# Patient Record
Sex: Male | Born: 1940 | Race: White | Hispanic: No | Marital: Married | Smoking: Former smoker
Health system: Southern US, Community
[De-identification: ages and names within clinical notes are randomized; demographics above are authoritative.]

## PROBLEM LIST (undated history)

## (undated) DIAGNOSIS — I10 Essential (primary) hypertension: Secondary | ICD-10-CM

## (undated) DIAGNOSIS — M1990 Unspecified osteoarthritis, unspecified site: Secondary | ICD-10-CM

## (undated) DIAGNOSIS — K219 Gastro-esophageal reflux disease without esophagitis: Secondary | ICD-10-CM

## (undated) DIAGNOSIS — Z923 Personal history of irradiation: Secondary | ICD-10-CM

## (undated) HISTORY — PX: EYE SURGERY: SHX253

## (undated) HISTORY — PX: ABSCESS DRAINAGE: SHX1119

## (undated) HISTORY — PX: TONSILLECTOMY: SUR1361

## (undated) HISTORY — PX: OTHER SURGICAL HISTORY: SHX169

---

## 2000-08-23 ENCOUNTER — Ambulatory Visit (HOSPITAL_COMMUNITY): Admission: RE | Admit: 2000-08-23 | Discharge: 2000-08-24 | Payer: Self-pay | Admitting: Ophthalmology

## 2000-08-23 ENCOUNTER — Encounter: Payer: Self-pay | Admitting: Ophthalmology

## 2003-09-10 ENCOUNTER — Encounter: Admission: RE | Admit: 2003-09-10 | Discharge: 2003-09-10 | Payer: Self-pay | Admitting: Internal Medicine

## 2008-10-14 ENCOUNTER — Encounter: Admission: RE | Admit: 2008-10-14 | Discharge: 2008-10-14 | Payer: Self-pay | Admitting: Internal Medicine

## 2008-10-19 ENCOUNTER — Emergency Department (HOSPITAL_COMMUNITY): Admission: EM | Admit: 2008-10-19 | Discharge: 2008-10-19 | Payer: Self-pay | Admitting: Emergency Medicine

## 2009-02-11 ENCOUNTER — Ambulatory Visit (HOSPITAL_COMMUNITY): Admission: RE | Admit: 2009-02-11 | Discharge: 2009-02-11 | Payer: Self-pay | Admitting: Cardiovascular Disease

## 2010-06-11 LAB — DIFFERENTIAL
Basophils Relative: 0 % (ref 0–1)
Eosinophils Absolute: 0.1 10*3/uL (ref 0.0–0.7)
Eosinophils Relative: 1 % (ref 0–5)
Lymphs Abs: 1.7 10*3/uL (ref 0.7–4.0)
Monocytes Relative: 13 % — ABNORMAL HIGH (ref 3–12)

## 2010-06-11 LAB — POCT I-STAT, CHEM 8
BUN: 8 mg/dL (ref 6–23)
Calcium, Ion: 1.18 mmol/L (ref 1.12–1.32)
Creatinine, Ser: 1.2 mg/dL (ref 0.4–1.5)
Glucose, Bld: 115 mg/dL — ABNORMAL HIGH (ref 70–99)
Hemoglobin: 16.7 g/dL (ref 13.0–17.0)
TCO2: 25 mmol/L (ref 0–100)

## 2010-06-11 LAB — CBC
HCT: 42.8 % (ref 39.0–52.0)
MCHC: 33.7 g/dL (ref 30.0–36.0)
MCV: 99.4 fL (ref 78.0–100.0)
RBC: 4.31 MIL/uL (ref 4.22–5.81)
WBC: 11.6 10*3/uL — ABNORMAL HIGH (ref 4.0–10.5)

## 2010-07-22 NOTE — Op Note (Signed)
[place]  Patient:    Alex Franklin, Alex Franklin                       MRN: [id number] Proc. Date: 08/23/00 Adm. Date:  [id number] Attending:  Bertrum Sol                           Operative Report  DATE OF BIRTH:  14-May-1940.  PREOPERATIVE DIAGNOSIS:  Rhegmatogenous retinal detachment of the right eye.  PROCEDURES: 1. Scleral buckle, right eye. 2. Retinal photocoagulation, right eye.  SURGEON:  Beulah Gandy. Ashley Royalty, M.D.  ASSISTANT:  Lu Duffel, C.O.A., S.A.  ANESTHESIA:  General.  DESCRIPTION OF PROCEDURE:  Usual prep and drape.  A 360 degree limbal peritomy, isolation of four rectus muscles on 2-0 silk.  Localization of break at 6 oclock.  Scleral dissection from 2 oclock around to 12 oclock to admit a #279 intrascleral implant.  Diathermy placed in the bed.  The buckle elements were placed, and two sutures per quadrant were placed in the scleral flaps.  A 240 band was placed around the eye with a 270 sleeve at 2 oclock.  Perforation site chosen at 7 oclock in the posterior aspect of the bed.  A large amount of clear, colorless subretinal fluid came forth in a controlled manner.  The sutures were pulled up snugly as the fluid egressed. A 508G radial segment was placed beneath the break at 6 oclock.  Once the buckle elements were placed and all the fluid was released, indirect ophthalmoscopy showed the retina to be lying nicely on the scleral buckle. Four hundred fifty-nine laser burns were placed on the scleral buckle with a power of 500 milliwatts, 1000 microns each, and 0.1 second each.  The suture ends were trimmed.  The band was adjusted to a proper length, and the band ends were trimmed.  The conjunctiva was reposited with 7-0 chromic suture. Polymyxin and gentamicin were irrigated into [place] space.  Atropine solution was applied.  Marcaine was injected around the globe for postop pain. Decadron 10 mg was injected into the lower  subconjunctival space.  Closing tension was 10 with a Barraquer tonometer.  COMPLICATIONS:  None.  DURATION:  Two hours.  The patient was awakened and taken to recovery in satisfactory condition. DD:  08/23/00 TD:  08/24/00 Job: [id number]

## 2010-07-24 IMAGING — CT CT ANGIO CHEST
2 of 6 series · 19 of 46 positions shown · IV contrast (APPLIED)
Comparison: 10/19/2008.

CLINICAL DATA: Chest pain.  Evaluate for pulmonary embolism.

CT ANGIOGRAPHY CHEST WITH CONTRAST
TECHNIQUE: Multidetector CT imaging of the chest was performed
using the standard protocol during bolus administration of
intravenous contrast. Multiplanar CT image reconstructions
including MIPs were obtained to evaluate the vascular anatomy.
Contrast: 80 ml Tmnipaque-L22.

[Series 5: pe thins @ 1mm · axial · 0.70mm/px · z∈[-321,-37]mm · 16 of 312 slices shown]
[im 14/312  lung]
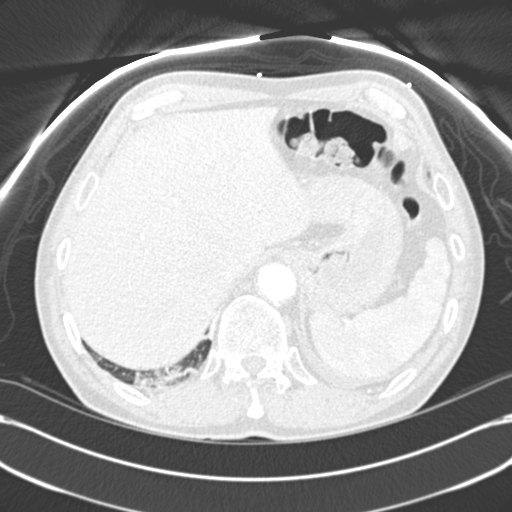
[im 41/312  soft-tissue]
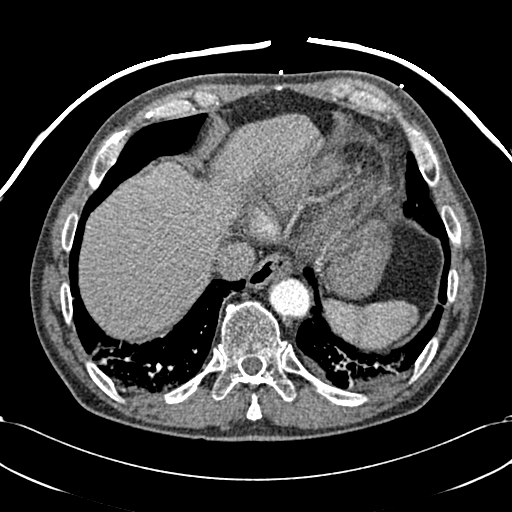
[im 55/312  lung]
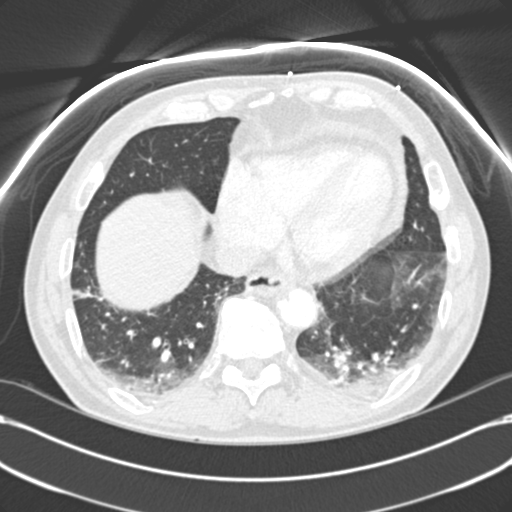
[im 68/312  soft-tissue]
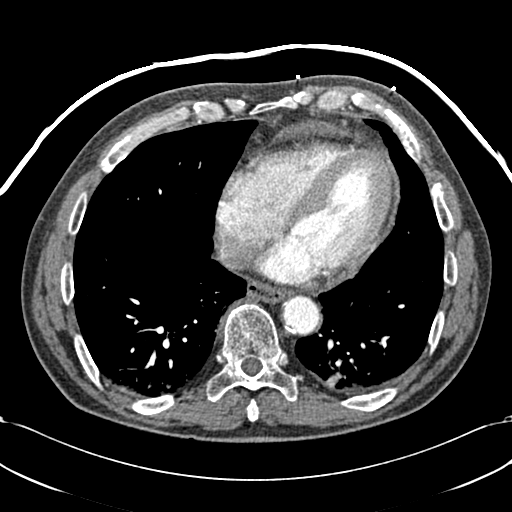
[im 95/312  lung]
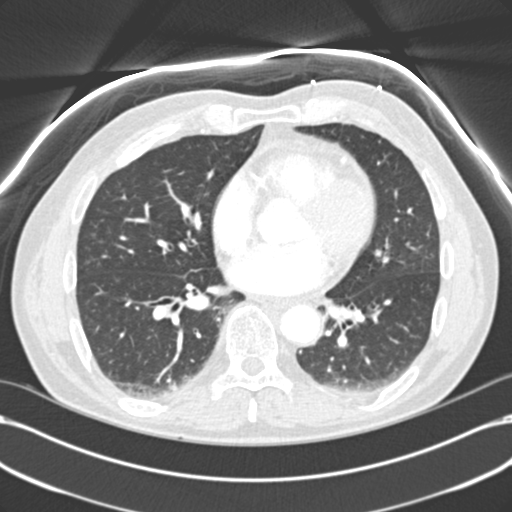
[im 109/312  soft-tissue]
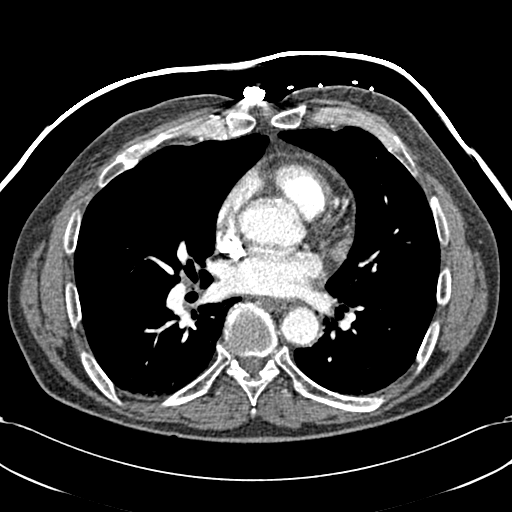
[im 122/312  lung]
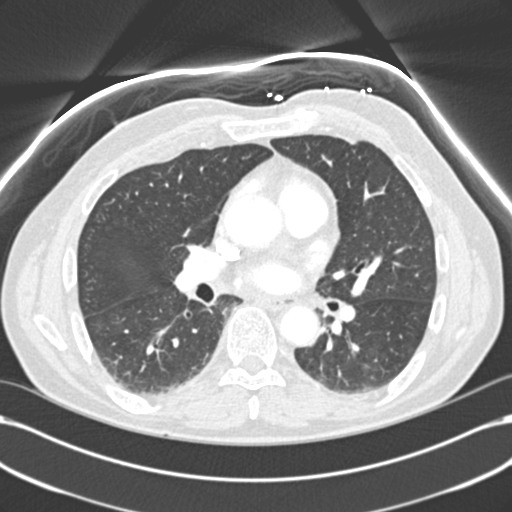
[im 149/312  soft-tissue]
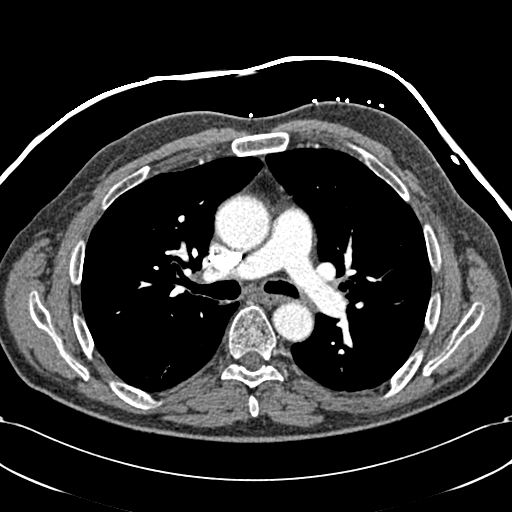
[im 163/312  lung]
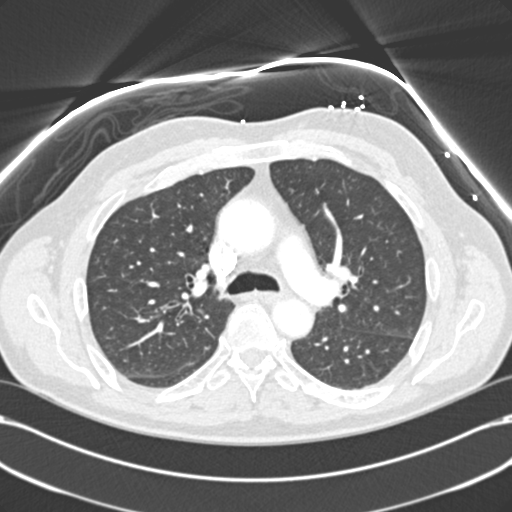
[im 190/312  soft-tissue]
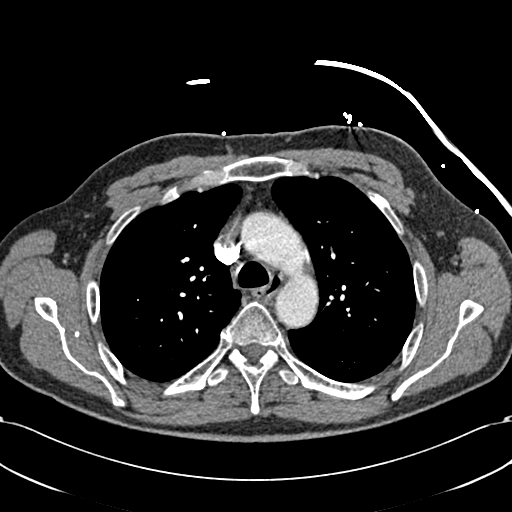
[im 203/312  lung]
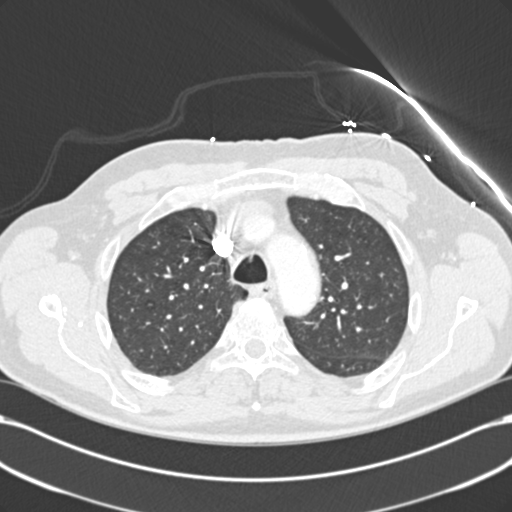
[im 217/312  soft-tissue]
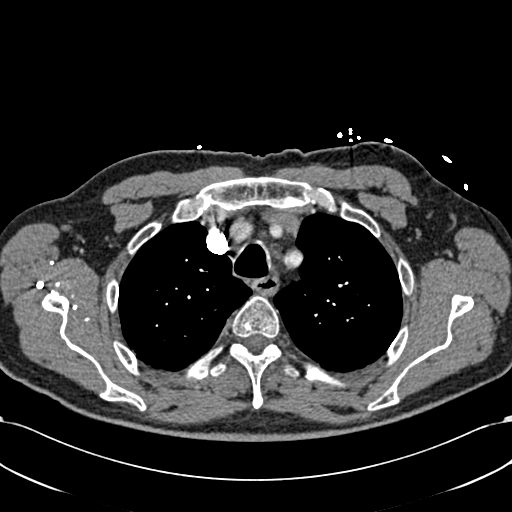
[im 244/312  lung]
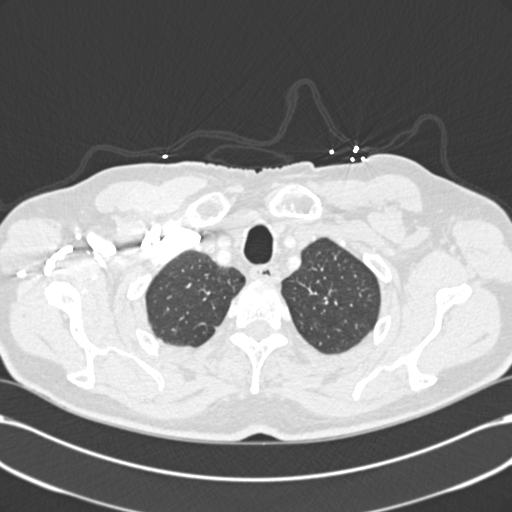
[im 257/312  soft-tissue]
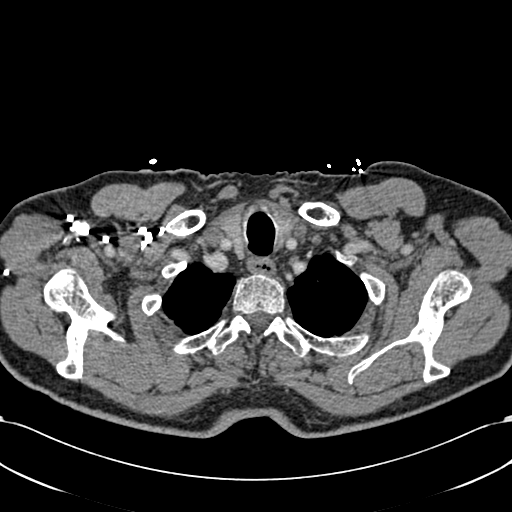
[im 271/312  lung]
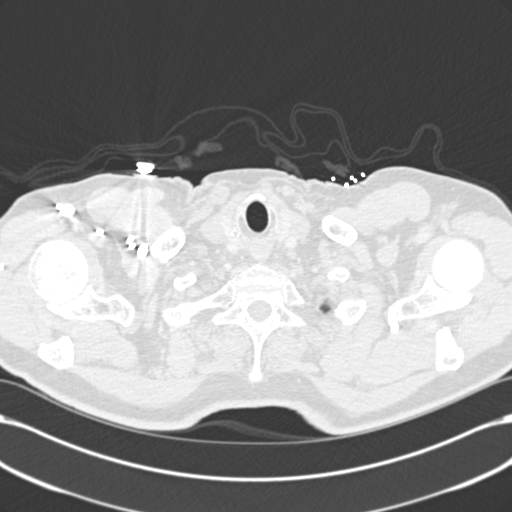
[im 298/312  soft-tissue]
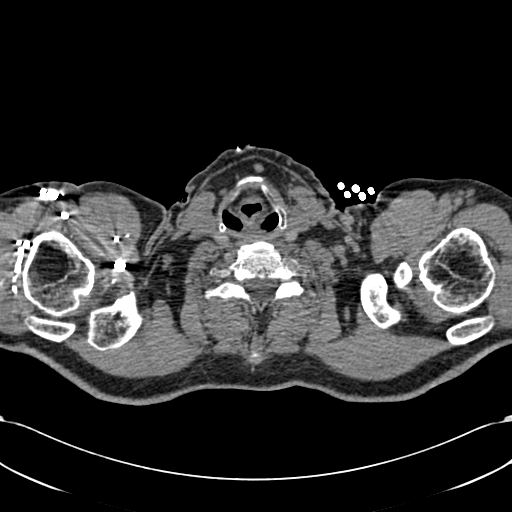

[Series 602: coronal mpr · coronal · 0.70mm/px · 3 of 128 slices shown]
[im 32/128  soft-tissue]
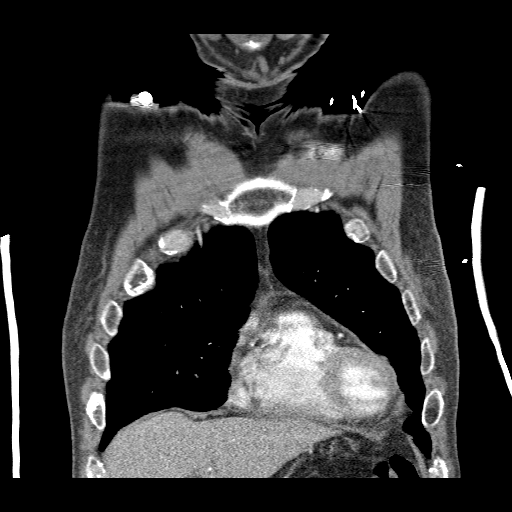
[im 64/128  soft-tissue]
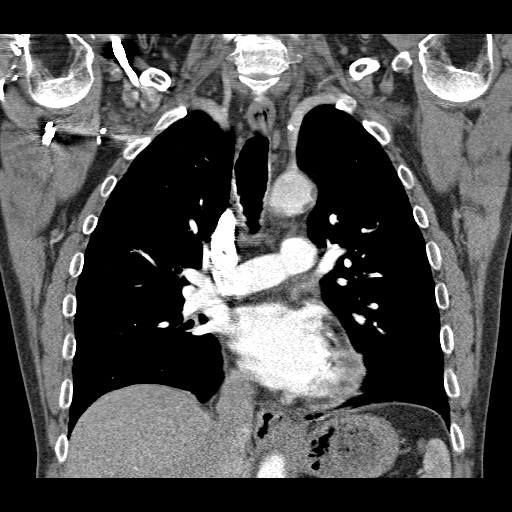
[im 96/128  soft-tissue]
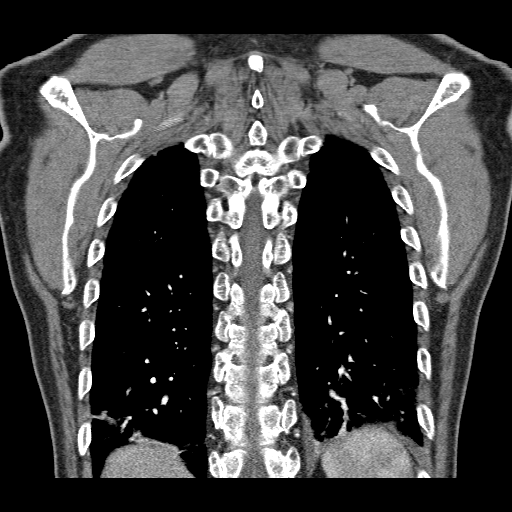

[19 of 46 positions shown; findings below may reference images not displayed]

FINDINGS: The study is technically adequate to evaluate for
pulmonary embolism.  No pulmonary embolus is present.  Acute aortic
abnormality.  Coronary artery atherosclerosis is present. If office
based assessment of coronary risk factors has not been performed,
it is now recommended.   Aortic atherosclerosis is present.  Bovine
arch.

No axillary adenopathy.  No pleural effusion is identified.  Small
amount of pericardial effusion or thickening is present anteriorly.
Incidental imaging of the upper abdomen is unremarkable.  Patulous
gastroesophageal junction. Lung windows demonstrate dependent
atelectasis, prominent in the lower lobes bilaterally.  No airspace
disease.  6 mm right upper lobe pulmonary nodule identified on
image number 55.  Follow-up required.  Healing and healed left
anterior and lateral rib fractures are present.  Some of these
demonstrate cortication and could represent pseudoarthrosis of the
ribs. Emphysematous changes are noted in the lungs.

Review of the MIP images confirms the above findings.
IMPRESSION: 1.  Technically adequate study without pulmonary embolus.
2.  Atherosclerosis and coronary artery disease.
3.  Anterior pericardial fluid or thickening.
4.  6 mm right upper lobe pulmonary nodule on image number 55.  3-
month follow-up chest CT recommended.

## 2011-08-23 ENCOUNTER — Other Ambulatory Visit (HOSPITAL_COMMUNITY): Payer: Self-pay | Admitting: Radiology

## 2011-08-23 DIAGNOSIS — J449 Chronic obstructive pulmonary disease, unspecified: Secondary | ICD-10-CM

## 2011-08-31 ENCOUNTER — Ambulatory Visit (HOSPITAL_COMMUNITY)
Admission: RE | Admit: 2011-08-31 | Discharge: 2011-08-31 | Disposition: A | Discharge status: Home / Self Care | Payer: Medicare Other | Source: Ambulatory Visit | Attending: Internal Medicine | Admitting: Internal Medicine

## 2011-08-31 DIAGNOSIS — J449 Chronic obstructive pulmonary disease, unspecified: Secondary | ICD-10-CM | POA: Insufficient documentation

## 2011-08-31 DIAGNOSIS — J4489 Other specified chronic obstructive pulmonary disease: Secondary | ICD-10-CM | POA: Insufficient documentation

## 2011-08-31 MED ORDER — ALBUTEROL SULFATE (5 MG/ML) 0.5% IN NEBU
2.5000 mg | INHALATION_SOLUTION | Freq: Once | RESPIRATORY_TRACT | Status: AC
  Administered 2011-08-31: 2.5 mg via RESPIRATORY_TRACT

## 2015-03-18 ENCOUNTER — Other Ambulatory Visit: Payer: Self-pay | Admitting: Internal Medicine

## 2015-03-18 ENCOUNTER — Ambulatory Visit
Admission: RE | Admit: 2015-03-18 | Discharge: 2015-03-18 | Disposition: A | Discharge status: Home / Self Care | Payer: PPO | Source: Ambulatory Visit | Attending: Internal Medicine | Admitting: Internal Medicine

## 2015-03-18 DIAGNOSIS — J849 Interstitial pulmonary disease, unspecified: Secondary | ICD-10-CM

## 2015-03-18 DIAGNOSIS — R059 Cough, unspecified: Secondary | ICD-10-CM

## 2015-03-18 DIAGNOSIS — R05 Cough: Secondary | ICD-10-CM | POA: Diagnosis not present

## 2015-03-18 DIAGNOSIS — C61 Malignant neoplasm of prostate: Secondary | ICD-10-CM | POA: Diagnosis not present

## 2015-03-18 DIAGNOSIS — I10 Essential (primary) hypertension: Secondary | ICD-10-CM | POA: Diagnosis not present

## 2015-03-18 DIAGNOSIS — E7800 Pure hypercholesterolemia, unspecified: Secondary | ICD-10-CM | POA: Diagnosis not present

## 2015-03-23 ENCOUNTER — Other Ambulatory Visit: Payer: Self-pay | Admitting: Internal Medicine

## 2015-04-06 ENCOUNTER — Other Ambulatory Visit: Payer: Self-pay | Admitting: Internal Medicine

## 2015-04-06 DIAGNOSIS — S22080A Wedge compression fracture of T11-T12 vertebra, initial encounter for closed fracture: Secondary | ICD-10-CM

## 2015-04-10 ENCOUNTER — Ambulatory Visit
Admission: RE | Admit: 2015-04-10 | Discharge: 2015-04-10 | Disposition: A | Discharge status: Home / Self Care | Payer: PPO | Source: Ambulatory Visit | Attending: Internal Medicine | Admitting: Internal Medicine

## 2015-04-10 DIAGNOSIS — M5134 Other intervertebral disc degeneration, thoracic region: Secondary | ICD-10-CM | POA: Diagnosis not present

## 2015-04-10 DIAGNOSIS — S22080A Wedge compression fracture of T11-T12 vertebra, initial encounter for closed fracture: Secondary | ICD-10-CM

## 2015-04-22 ENCOUNTER — Other Ambulatory Visit: Payer: Self-pay | Admitting: Gastroenterology

## 2015-05-31 ENCOUNTER — Encounter (HOSPITAL_COMMUNITY): Payer: Self-pay | Admitting: *Deleted

## 2015-06-07 ENCOUNTER — Encounter (HOSPITAL_COMMUNITY)
Admission: RE | Disposition: A | Discharge status: Home / Self Care | Payer: Self-pay | Source: Ambulatory Visit | Attending: Gastroenterology

## 2015-06-07 ENCOUNTER — Encounter (HOSPITAL_COMMUNITY): Payer: Self-pay

## 2015-06-07 ENCOUNTER — Ambulatory Visit (HOSPITAL_COMMUNITY): Payer: PPO | Admitting: Certified Registered"

## 2015-06-07 ENCOUNTER — Ambulatory Visit (HOSPITAL_COMMUNITY)
Admission: RE | Admit: 2015-06-07 | Discharge: 2015-06-07 | Disposition: A | Discharge status: Home / Self Care | Payer: PPO | Source: Ambulatory Visit | Attending: Gastroenterology | Admitting: Gastroenterology

## 2015-06-07 DIAGNOSIS — M1990 Unspecified osteoarthritis, unspecified site: Secondary | ICD-10-CM | POA: Diagnosis not present

## 2015-06-07 DIAGNOSIS — I10 Essential (primary) hypertension: Secondary | ICD-10-CM | POA: Diagnosis not present

## 2015-06-07 DIAGNOSIS — Z85828 Personal history of other malignant neoplasm of skin: Secondary | ICD-10-CM | POA: Diagnosis not present

## 2015-06-07 DIAGNOSIS — Z888 Allergy status to other drugs, medicaments and biological substances status: Secondary | ICD-10-CM | POA: Insufficient documentation

## 2015-06-07 DIAGNOSIS — Z87891 Personal history of nicotine dependence: Secondary | ICD-10-CM | POA: Insufficient documentation

## 2015-06-07 DIAGNOSIS — E7800 Pure hypercholesterolemia, unspecified: Secondary | ICD-10-CM | POA: Insufficient documentation

## 2015-06-07 DIAGNOSIS — Z1211 Encounter for screening for malignant neoplasm of colon: Secondary | ICD-10-CM | POA: Diagnosis not present

## 2015-06-07 DIAGNOSIS — K219 Gastro-esophageal reflux disease without esophagitis: Secondary | ICD-10-CM | POA: Diagnosis not present

## 2015-06-07 DIAGNOSIS — J449 Chronic obstructive pulmonary disease, unspecified: Secondary | ICD-10-CM | POA: Insufficient documentation

## 2015-06-07 DIAGNOSIS — Z8546 Personal history of malignant neoplasm of prostate: Secondary | ICD-10-CM | POA: Insufficient documentation

## 2015-06-07 HISTORY — PX: COLONOSCOPY WITH PROPOFOL: SHX5780

## 2015-06-07 HISTORY — DX: Essential (primary) hypertension: I10

## 2015-06-07 HISTORY — DX: Gastro-esophageal reflux disease without esophagitis: K21.9

## 2015-06-07 HISTORY — DX: Unspecified osteoarthritis, unspecified site: M19.90

## 2015-06-07 SURGERY — COLONOSCOPY WITH PROPOFOL
Anesthesia: Monitor Anesthesia Care

## 2015-06-07 MED ORDER — SODIUM CHLORIDE 0.9 % IV SOLN: INTRAVENOUS | Status: DC

## 2015-06-07 MED ORDER — LACTATED RINGERS IV SOLN
INTRAVENOUS | Status: DC
  Administered 2015-06-07: 11:00:00 via INTRAVENOUS

## 2015-06-07 MED ORDER — PROPOFOL 10 MG/ML IV BOLUS
INTRAVENOUS | Status: AC
  Filled 2015-06-07: qty 20

## 2015-06-07 MED ORDER — PROPOFOL 10 MG/ML IV BOLUS
INTRAVENOUS | Status: DC | PRN
  Administered 2015-06-07: 50 mg via INTRAVENOUS
  Administered 2015-06-07: 100 mg via INTRAVENOUS

## 2015-06-07 SURGICAL SUPPLY — 22 items

## 2015-06-07 NOTE — Transfer of Care (Signed)
Immediate Anesthesia Transfer of Care Note  Patient: Alex Franklin  Procedure(s) Performed: Procedure(s): COLONOSCOPY WITH PROPOFOL (N/A)  Patient Location: PACU  Anesthesia Type:MAC  Level of Consciousness: awake, alert , oriented and patient cooperative  Airway & Oxygen Therapy: Patient Spontanous Breathing  Post-op Assessment: Report given to RN, Post -op Vital signs reviewed and stable and Patient moving all extremities X 4  Post vital signs: Reviewed and stable  Last Vitals:  Filed Vitals:   06/07/15 1031  BP: 165/72  Pulse: 66  Temp: 36.5 C  Resp: 18    Complications: No apparent anesthesia complications

## 2015-06-07 NOTE — Anesthesia Preprocedure Evaluation (Signed)
Anesthesia Evaluation  Patient identified by MRN, date of birth, ID band  Reviewed: Allergy & Precautions, NPO status , Patient's Chart, lab work & pertinent test results  History of Anesthesia Complications Negative for: history of anesthetic complications  Airway Mallampati: II  TM Distance: >3 FB Neck ROM: Full    Dental  (+) Upper Dentures, Partial Lower   Pulmonary neg shortness of breath, neg sleep apnea, neg COPD, neg recent URI, former smoker,    breath sounds clear to auscultation       Cardiovascular hypertension, Pt. on medications  Rhythm:Regular     Neuro/Psych negative neurological ROS  negative psych ROS   GI/Hepatic Neg liver ROS, GERD  Medicated and Controlled,  Endo/Other  negative endocrine ROS  Renal/GU negative Renal ROS     Musculoskeletal  (+) Arthritis ,   Abdominal   Peds  Hematology negative hematology ROS (+)   Anesthesia Other Findings   Reproductive/Obstetrics                             Anesthesia Physical Anesthesia Plan  ASA: II  Anesthesia Plan: MAC   Post-op Pain Management:    Induction: Intravenous  Airway Management Planned: Nasal Cannula, Simple Face Mask and Natural Airway  Additional Equipment: None  Intra-op Plan:   Post-operative Plan:   Informed Consent: I have reviewed the patients History and Physical, chart, labs and discussed the procedure including the risks, benefits and alternatives for the proposed anesthesia with the patient or authorized representative who has indicated his/her understanding and acceptance.   Dental advisory given  Plan Discussed with: CRNA  Anesthesia Plan Comments:         Anesthesia Quick Evaluation

## 2015-06-07 NOTE — Discharge Instructions (Signed)
Colonoscopy °A colonoscopy is an exam to look at your colon. This exam can help find lumps (tumors), growths (polyps), bleeding, and redness and puffiness (inflammation) in your colon.  °BEFORE THE PROCEDURE °· Ask your doctor about changing or stopping your regular medicines. °· You may need to drink a large amount of a special liquid (oral bowel prep). You start drinking this the day before your procedure. It will cause you to have watery poop (stool). This cleans out your colon. °· Do not eat or drink anything else once you have started the bowel prep, unless your doctor tells you it is safe to do so. °· Make plans for someone to drive you home after the procedure. °PROCEDURE °· You will be given medicine to help you relax (sedative). °· You will lie on your side with your knees bent. °· A tube with a camera on the end is put in the opening of your butt (anus) and into your colon. Pictures are sent to a computer screen. Your doctor will look for anything that is not normal. °· Your doctor may take a tissue sample (biopsy) from your colon to be looked at more closely. °· The exam is finished when your doctor has viewed all of the colon. °AFTER THE PROCEDURE °· Do not drive for 24 hours after the exam. °· You may have a small amount of blood in your poop. This is normal. °· You may pass gas and have belly (abdominal) cramps. This is normal. °· Ask when your test results will be ready. Make sure you get your test results. °  °This information is not intended to replace advice given to you by your health care provider. Make sure you discuss any questions you have with your health care provider. °  °Document Released: 03/25/2010 Document Revised: 02/25/2013 Document Reviewed: 10/28/2012 °Elsevier Interactive Patient Education ©2016 [employer] ° °

## 2015-06-07 NOTE — Anesthesia Postprocedure Evaluation (Signed)
Anesthesia Post Note  Patient: Alex Franklin  Procedure(s) Performed: Procedure(s) (LRB): COLONOSCOPY WITH PROPOFOL (N/A)  Patient location during evaluation: Endoscopy Anesthesia Type: MAC Level of consciousness: awake Pain management: pain level controlled Vital Signs Assessment: post-procedure vital signs reviewed and stable Respiratory status: spontaneous breathing Cardiovascular status: stable Postop Assessment: no signs of nausea or vomiting Anesthetic complications: no    Last Vitals:  Filed Vitals:   06/07/15 1116 06/07/15 1119  BP: 127/67   Pulse: 61   Temp:  36 C  Resp: 15     Last Pain: There were no vitals filed for this visit.               Hailley Byers

## 2015-06-07 NOTE — Op Note (Signed)
[facility], [place] Patient Name: Alex Franklin Procedure Date: 06/07/2015 MRN: [id number] Attending MD: Garlan Fair , MD Date of Birth: Sep 28, 1940 CSN:  Age: 75 Admit Type: Outpatient Procedure:                Colonoscopy Indications:              Screening for colorectal malignant neoplasm. Normal                            screening colonoscopy was performed on 12/05/2005 Providers:                Garlan Fair, MD, Tory Emerald, RN, Cletis Athens, Technician Referring MD:              Medicines:                Propofol per Anesthesia Complications:            No immediate complications. Estimated Blood Loss:     Estimated blood loss: none. Procedure:                Pre-Anesthesia Assessment:                           - Prior to the procedure, a History and Physical                            was performed, and patient medications and                            allergies were reviewed. The patient's tolerance of                            previous anesthesia was also reviewed. The risks                            and benefits of the procedure and the sedation                            options and risks were discussed with the patient.                            All questions were answered, and informed consent                            was obtained. Prior Anticoagulants: The patient has                            taken aspirin, last dose was day of procedure. ASA                            Grade Assessment: III - A patient with severe  systemic disease. After reviewing the risks and                            benefits, the patient was deemed in satisfactory                            condition to undergo the procedure.                           After obtaining informed consent, the colonoscope                            was passed under direct vision. Throughout the                            procedure, the  patient's blood pressure, pulse, and                            oxygen saturations were monitored continuously. The                            [id number] scope was introduced through                            the anus and advanced to the the cecum, identified                            by appendiceal orifice and ileocecal valve. The                            colonoscopy was performed without difficulty. The                            patient tolerated the procedure well. The quality                            of the bowel preparation was good. The appendiceal                            orifice and the rectum were photographed. Scope In: 10:55:47 AM Scope Out: 11:11:15 AM Scope Withdrawal Time: 0 hours 13 minutes 7 seconds  Total Procedure Duration: 0 hours 15 minutes 28 seconds  Findings:      The perianal and digital rectal examinations were normal.      The entire examined colon appeared normal. Impression:               - The entire examined colon is normal.                           - No specimens collected. Moderate Sedation:      N/A- Per Anesthesia Care Recommendation:           - Patient has a contact number available for  emergencies. The signs and symptoms of potential                            delayed complications were discussed with the                            patient. Return to normal activities tomorrow.                            Written discharge instructions were provided to the                            patient.                           - Repeat colonoscopy is not recommended for                            screening purposes.                           - Resume previous diet.                           - Continue present medications. Procedure Code(s):        --- Professional ---                           [phone], Colonoscopy, flexible; diagnostic, including                            collection of specimen(s) by brushing or  washing,                            when performed (separate procedure) Diagnosis Code(s):        --- Professional ---                           Z12.11, Encounter for screening for malignant                            neoplasm of colon CPT copyright 2016 American Medical Association. All rights reserved. The codes documented in this report are preliminary and upon coder review may  be revised to meet current compliance requirements. Earle Gell, MD Garlan Fair, MD 06/07/2015 11:17:33 AM This report has been signed electronically. Number of Addenda: 0

## 2015-06-07 NOTE — H&P (Signed)
  Procedure: Screening colonoscopy. Normal screening colonoscopy performed on 12/05/2005  History: The patient is a 75 year old male born [birth year]. He is scheduled to undergo a screening colonoscopy today  Past medical history: Hypertension. Hypercholesterolemia. Prostatectomy performed for prostate cancer. Interstitial lung disease. Squamous cell skin cancer. Detached retina repair. Lumbar spinal stenosis. Chronic obstructive pulmonary disease. Right lung nodule by CT scan. Bilateral cataract surgery. Vasectomy.  Medication allergies: Hydrochlorothiazide caused hyponatremic  Exam: The patient is alert and lying comfortably on the endoscopy stretcher. Abdomen is soft and nontender to palpation. Lungs are clear to auscultation. Cardiac exam reveals a regular rhythm.  Plan: Proceed with screening colonoscopy

## 2015-06-08 DIAGNOSIS — L570 Actinic keratosis: Secondary | ICD-10-CM | POA: Diagnosis not present

## 2015-06-08 DIAGNOSIS — D225 Melanocytic nevi of trunk: Secondary | ICD-10-CM | POA: Diagnosis not present

## 2015-06-08 DIAGNOSIS — B353 Tinea pedis: Secondary | ICD-10-CM | POA: Diagnosis not present

## 2015-06-08 DIAGNOSIS — D692 Other nonthrombocytopenic purpura: Secondary | ICD-10-CM | POA: Diagnosis not present

## 2015-06-08 DIAGNOSIS — L821 Other seborrheic keratosis: Secondary | ICD-10-CM | POA: Diagnosis not present

## 2015-06-09 ENCOUNTER — Encounter (HOSPITAL_COMMUNITY): Payer: Self-pay | Admitting: Gastroenterology

## 2015-06-29 DIAGNOSIS — R05 Cough: Secondary | ICD-10-CM | POA: Diagnosis not present

## 2015-06-29 DIAGNOSIS — J849 Interstitial pulmonary disease, unspecified: Secondary | ICD-10-CM | POA: Diagnosis not present

## 2015-06-29 DIAGNOSIS — J019 Acute sinusitis, unspecified: Secondary | ICD-10-CM | POA: Diagnosis not present

## 2015-07-09 DIAGNOSIS — Z0000 Encounter for general adult medical examination without abnormal findings: Secondary | ICD-10-CM | POA: Diagnosis not present

## 2015-07-09 DIAGNOSIS — R3129 Other microscopic hematuria: Secondary | ICD-10-CM | POA: Diagnosis not present

## 2015-07-09 DIAGNOSIS — Z8546 Personal history of malignant neoplasm of prostate: Secondary | ICD-10-CM | POA: Diagnosis not present

## 2015-07-16 DIAGNOSIS — G8929 Other chronic pain: Secondary | ICD-10-CM | POA: Diagnosis not present

## 2015-07-16 DIAGNOSIS — M5136 Other intervertebral disc degeneration, lumbar region: Secondary | ICD-10-CM | POA: Diagnosis not present

## 2015-07-16 DIAGNOSIS — M545 Low back pain: Secondary | ICD-10-CM | POA: Diagnosis not present

## 2015-07-20 DIAGNOSIS — R3129 Other microscopic hematuria: Secondary | ICD-10-CM | POA: Diagnosis not present

## 2015-07-20 DIAGNOSIS — N281 Cyst of kidney, acquired: Secondary | ICD-10-CM | POA: Diagnosis not present

## 2015-08-11 DIAGNOSIS — M5136 Other intervertebral disc degeneration, lumbar region: Secondary | ICD-10-CM | POA: Diagnosis not present

## 2015-09-21 DIAGNOSIS — M4854XA Collapsed vertebra, not elsewhere classified, thoracic region, initial encounter for fracture: Secondary | ICD-10-CM | POA: Diagnosis not present

## 2015-09-21 DIAGNOSIS — J849 Interstitial pulmonary disease, unspecified: Secondary | ICD-10-CM | POA: Diagnosis not present

## 2015-09-21 DIAGNOSIS — R05 Cough: Secondary | ICD-10-CM | POA: Diagnosis not present

## 2015-09-21 DIAGNOSIS — Z0000 Encounter for general adult medical examination without abnormal findings: Secondary | ICD-10-CM | POA: Diagnosis not present

## 2015-09-21 DIAGNOSIS — Z1389 Encounter for screening for other disorder: Secondary | ICD-10-CM | POA: Diagnosis not present

## 2015-09-21 DIAGNOSIS — J440 Chronic obstructive pulmonary disease with acute lower respiratory infection: Secondary | ICD-10-CM | POA: Diagnosis not present

## 2015-09-21 DIAGNOSIS — E7800 Pure hypercholesterolemia, unspecified: Secondary | ICD-10-CM | POA: Diagnosis not present

## 2015-09-21 DIAGNOSIS — Z8546 Personal history of malignant neoplasm of prostate: Secondary | ICD-10-CM | POA: Diagnosis not present

## 2015-09-21 DIAGNOSIS — E871 Hypo-osmolality and hyponatremia: Secondary | ICD-10-CM | POA: Diagnosis not present

## 2015-09-21 DIAGNOSIS — I10 Essential (primary) hypertension: Secondary | ICD-10-CM | POA: Diagnosis not present

## 2015-10-08 DIAGNOSIS — Z8546 Personal history of malignant neoplasm of prostate: Secondary | ICD-10-CM | POA: Diagnosis not present

## 2015-10-08 DIAGNOSIS — D49511 Neoplasm of unspecified behavior of right kidney: Secondary | ICD-10-CM | POA: Diagnosis not present

## 2015-10-08 DIAGNOSIS — R3121 Asymptomatic microscopic hematuria: Secondary | ICD-10-CM | POA: Diagnosis not present

## 2015-10-19 DIAGNOSIS — E7800 Pure hypercholesterolemia, unspecified: Secondary | ICD-10-CM | POA: Diagnosis not present

## 2015-10-19 DIAGNOSIS — I10 Essential (primary) hypertension: Secondary | ICD-10-CM | POA: Diagnosis not present

## 2015-10-19 DIAGNOSIS — Z1389 Encounter for screening for other disorder: Secondary | ICD-10-CM | POA: Diagnosis not present

## 2015-10-19 DIAGNOSIS — C61 Malignant neoplasm of prostate: Secondary | ICD-10-CM | POA: Diagnosis not present

## 2015-10-19 DIAGNOSIS — J440 Chronic obstructive pulmonary disease with acute lower respiratory infection: Secondary | ICD-10-CM | POA: Diagnosis not present

## 2015-10-19 DIAGNOSIS — J849 Interstitial pulmonary disease, unspecified: Secondary | ICD-10-CM | POA: Diagnosis not present

## 2016-04-20 DIAGNOSIS — C61 Malignant neoplasm of prostate: Secondary | ICD-10-CM | POA: Diagnosis not present

## 2016-04-20 DIAGNOSIS — J849 Interstitial pulmonary disease, unspecified: Secondary | ICD-10-CM | POA: Diagnosis not present

## 2016-04-20 DIAGNOSIS — J440 Chronic obstructive pulmonary disease with acute lower respiratory infection: Secondary | ICD-10-CM | POA: Diagnosis not present

## 2016-04-20 DIAGNOSIS — E871 Hypo-osmolality and hyponatremia: Secondary | ICD-10-CM | POA: Diagnosis not present

## 2016-04-20 DIAGNOSIS — E7800 Pure hypercholesterolemia, unspecified: Secondary | ICD-10-CM | POA: Diagnosis not present

## 2016-04-20 DIAGNOSIS — I10 Essential (primary) hypertension: Secondary | ICD-10-CM | POA: Diagnosis not present

## 2016-04-20 DIAGNOSIS — Z23 Encounter for immunization: Secondary | ICD-10-CM | POA: Diagnosis not present

## 2016-08-22 DIAGNOSIS — L821 Other seborrheic keratosis: Secondary | ICD-10-CM | POA: Diagnosis not present

## 2016-08-22 DIAGNOSIS — B353 Tinea pedis: Secondary | ICD-10-CM | POA: Diagnosis not present

## 2016-08-22 DIAGNOSIS — D224 Melanocytic nevi of scalp and neck: Secondary | ICD-10-CM | POA: Diagnosis not present

## 2016-08-22 DIAGNOSIS — D692 Other nonthrombocytopenic purpura: Secondary | ICD-10-CM | POA: Diagnosis not present

## 2016-08-22 DIAGNOSIS — L570 Actinic keratosis: Secondary | ICD-10-CM | POA: Diagnosis not present

## 2017-01-03 ENCOUNTER — Other Ambulatory Visit (HOSPITAL_COMMUNITY): Payer: Self-pay | Admitting: Urology

## 2017-01-03 DIAGNOSIS — R3121 Asymptomatic microscopic hematuria: Secondary | ICD-10-CM | POA: Diagnosis not present

## 2017-01-03 DIAGNOSIS — Z8546 Personal history of malignant neoplasm of prostate: Secondary | ICD-10-CM | POA: Diagnosis not present

## 2017-01-03 DIAGNOSIS — D49511 Neoplasm of unspecified behavior of right kidney: Secondary | ICD-10-CM

## 2017-01-17 ENCOUNTER — Ambulatory Visit (HOSPITAL_COMMUNITY)
Admission: RE | Admit: 2017-01-17 | Discharge: 2017-01-17 | Disposition: A | Discharge status: Home / Self Care | Payer: PPO | Source: Ambulatory Visit | Attending: Urology | Admitting: Urology

## 2017-01-31 ENCOUNTER — Ambulatory Visit (HOSPITAL_COMMUNITY)
Admission: RE | Admit: 2017-01-31 | Discharge: 2017-01-31 | Disposition: A | Discharge status: Home / Self Care | Payer: PPO | Source: Ambulatory Visit | Attending: Urology | Admitting: Urology

## 2017-01-31 DIAGNOSIS — R911 Solitary pulmonary nodule: Secondary | ICD-10-CM | POA: Diagnosis not present

## 2017-01-31 DIAGNOSIS — N281 Cyst of kidney, acquired: Secondary | ICD-10-CM | POA: Insufficient documentation

## 2017-01-31 DIAGNOSIS — D49511 Neoplasm of unspecified behavior of right kidney: Secondary | ICD-10-CM | POA: Insufficient documentation

## 2017-01-31 DIAGNOSIS — I700 Atherosclerosis of aorta: Secondary | ICD-10-CM | POA: Insufficient documentation

## 2017-01-31 MED ORDER — GADOBENATE DIMEGLUMINE 529 MG/ML IV SOLN
20.0000 mL | Freq: Once | INTRAVENOUS | Status: AC | PRN
  Administered 2017-01-31: 16 mL via INTRAVENOUS

## 2017-08-01 DIAGNOSIS — E7800 Pure hypercholesterolemia, unspecified: Secondary | ICD-10-CM | POA: Diagnosis not present

## 2017-08-01 DIAGNOSIS — I10 Essential (primary) hypertension: Secondary | ICD-10-CM | POA: Diagnosis not present

## 2017-08-01 DIAGNOSIS — J440 Chronic obstructive pulmonary disease with acute lower respiratory infection: Secondary | ICD-10-CM | POA: Diagnosis not present

## 2017-08-01 DIAGNOSIS — F17210 Nicotine dependence, cigarettes, uncomplicated: Secondary | ICD-10-CM | POA: Diagnosis not present

## 2017-08-28 DIAGNOSIS — J440 Chronic obstructive pulmonary disease with acute lower respiratory infection: Secondary | ICD-10-CM | POA: Diagnosis not present

## 2017-08-28 DIAGNOSIS — M48061 Spinal stenosis, lumbar region without neurogenic claudication: Secondary | ICD-10-CM | POA: Diagnosis not present

## 2017-08-28 DIAGNOSIS — C61 Malignant neoplasm of prostate: Secondary | ICD-10-CM | POA: Diagnosis not present

## 2017-08-28 DIAGNOSIS — Z0000 Encounter for general adult medical examination without abnormal findings: Secondary | ICD-10-CM | POA: Diagnosis not present

## 2017-08-28 DIAGNOSIS — E7800 Pure hypercholesterolemia, unspecified: Secondary | ICD-10-CM | POA: Diagnosis not present

## 2017-08-28 DIAGNOSIS — Z1389 Encounter for screening for other disorder: Secondary | ICD-10-CM | POA: Diagnosis not present

## 2017-08-28 DIAGNOSIS — I10 Essential (primary) hypertension: Secondary | ICD-10-CM | POA: Diagnosis not present

## 2017-08-28 DIAGNOSIS — E871 Hypo-osmolality and hyponatremia: Secondary | ICD-10-CM | POA: Diagnosis not present

## 2017-09-03 DIAGNOSIS — C61 Malignant neoplasm of prostate: Secondary | ICD-10-CM | POA: Diagnosis not present

## 2017-09-03 DIAGNOSIS — R3121 Asymptomatic microscopic hematuria: Secondary | ICD-10-CM | POA: Diagnosis not present

## 2017-09-03 DIAGNOSIS — N281 Cyst of kidney, acquired: Secondary | ICD-10-CM | POA: Diagnosis not present

## 2017-09-03 DIAGNOSIS — R9721 Rising PSA following treatment for malignant neoplasm of prostate: Secondary | ICD-10-CM | POA: Diagnosis not present

## 2017-12-11 DIAGNOSIS — D225 Melanocytic nevi of trunk: Secondary | ICD-10-CM | POA: Diagnosis not present

## 2017-12-11 DIAGNOSIS — L820 Inflamed seborrheic keratosis: Secondary | ICD-10-CM | POA: Diagnosis not present

## 2017-12-11 DIAGNOSIS — D224 Melanocytic nevi of scalp and neck: Secondary | ICD-10-CM | POA: Diagnosis not present

## 2017-12-11 DIAGNOSIS — D1801 Hemangioma of skin and subcutaneous tissue: Secondary | ICD-10-CM | POA: Diagnosis not present

## 2017-12-11 DIAGNOSIS — L821 Other seborrheic keratosis: Secondary | ICD-10-CM | POA: Diagnosis not present

## 2017-12-11 DIAGNOSIS — L603 Nail dystrophy: Secondary | ICD-10-CM | POA: Diagnosis not present

## 2018-03-04 DIAGNOSIS — Z8546 Personal history of malignant neoplasm of prostate: Secondary | ICD-10-CM | POA: Diagnosis not present

## 2018-03-04 DIAGNOSIS — I10 Essential (primary) hypertension: Secondary | ICD-10-CM | POA: Diagnosis not present

## 2018-03-04 DIAGNOSIS — J849 Interstitial pulmonary disease, unspecified: Secondary | ICD-10-CM | POA: Diagnosis not present

## 2018-03-04 DIAGNOSIS — E7800 Pure hypercholesterolemia, unspecified: Secondary | ICD-10-CM | POA: Diagnosis not present

## 2018-03-04 DIAGNOSIS — I700 Atherosclerosis of aorta: Secondary | ICD-10-CM | POA: Diagnosis not present

## 2018-03-04 DIAGNOSIS — Z9079 Acquired absence of other genital organ(s): Secondary | ICD-10-CM | POA: Diagnosis not present

## 2018-03-04 DIAGNOSIS — J440 Chronic obstructive pulmonary disease with acute lower respiratory infection: Secondary | ICD-10-CM | POA: Diagnosis not present

## 2018-08-21 DIAGNOSIS — H5202 Hypermetropia, left eye: Secondary | ICD-10-CM | POA: Diagnosis not present

## 2018-08-21 DIAGNOSIS — H52222 Regular astigmatism, left eye: Secondary | ICD-10-CM | POA: Diagnosis not present

## 2018-08-21 DIAGNOSIS — Z961 Presence of intraocular lens: Secondary | ICD-10-CM | POA: Diagnosis not present

## 2018-08-21 DIAGNOSIS — H33031 Retinal detachment with giant retinal tear, right eye: Secondary | ICD-10-CM | POA: Diagnosis not present

## 2018-09-18 DIAGNOSIS — E871 Hypo-osmolality and hyponatremia: Secondary | ICD-10-CM | POA: Diagnosis not present

## 2018-09-18 DIAGNOSIS — E7800 Pure hypercholesterolemia, unspecified: Secondary | ICD-10-CM | POA: Diagnosis not present

## 2018-09-18 DIAGNOSIS — J440 Chronic obstructive pulmonary disease with acute lower respiratory infection: Secondary | ICD-10-CM | POA: Diagnosis not present

## 2018-09-18 DIAGNOSIS — C61 Malignant neoplasm of prostate: Secondary | ICD-10-CM | POA: Diagnosis not present

## 2018-09-18 DIAGNOSIS — I700 Atherosclerosis of aorta: Secondary | ICD-10-CM | POA: Diagnosis not present

## 2018-09-18 DIAGNOSIS — Z1389 Encounter for screening for other disorder: Secondary | ICD-10-CM | POA: Diagnosis not present

## 2018-09-18 DIAGNOSIS — I10 Essential (primary) hypertension: Secondary | ICD-10-CM | POA: Diagnosis not present

## 2018-09-18 DIAGNOSIS — J849 Interstitial pulmonary disease, unspecified: Secondary | ICD-10-CM | POA: Diagnosis not present

## 2018-09-18 DIAGNOSIS — Z0000 Encounter for general adult medical examination without abnormal findings: Secondary | ICD-10-CM | POA: Diagnosis not present

## 2018-10-01 ENCOUNTER — Other Ambulatory Visit: Payer: Self-pay

## 2018-10-01 ENCOUNTER — Other Ambulatory Visit: Payer: Self-pay | Admitting: Internal Medicine

## 2018-10-01 ENCOUNTER — Ambulatory Visit
Admission: RE | Admit: 2018-10-01 | Discharge: 2018-10-01 | Disposition: A | Discharge status: Home / Self Care | Payer: PPO | Source: Ambulatory Visit | Attending: Internal Medicine | Admitting: Internal Medicine

## 2018-10-01 DIAGNOSIS — I10 Essential (primary) hypertension: Secondary | ICD-10-CM | POA: Diagnosis not present

## 2018-10-01 DIAGNOSIS — M4696 Unspecified inflammatory spondylopathy, lumbar region: Secondary | ICD-10-CM | POA: Diagnosis not present

## 2018-10-01 DIAGNOSIS — Z8546 Personal history of malignant neoplasm of prostate: Secondary | ICD-10-CM | POA: Diagnosis not present

## 2018-10-01 DIAGNOSIS — E7800 Pure hypercholesterolemia, unspecified: Secondary | ICD-10-CM | POA: Diagnosis not present

## 2018-10-01 DIAGNOSIS — C61 Malignant neoplasm of prostate: Secondary | ICD-10-CM

## 2018-10-01 DIAGNOSIS — J449 Chronic obstructive pulmonary disease, unspecified: Secondary | ICD-10-CM | POA: Diagnosis not present

## 2018-10-01 DIAGNOSIS — Z87891 Personal history of nicotine dependence: Secondary | ICD-10-CM | POA: Diagnosis not present

## 2018-12-06 DIAGNOSIS — M47816 Spondylosis without myelopathy or radiculopathy, lumbar region: Secondary | ICD-10-CM | POA: Diagnosis not present

## 2018-12-17 DIAGNOSIS — S0101XA Laceration without foreign body of scalp, initial encounter: Secondary | ICD-10-CM | POA: Diagnosis not present

## 2018-12-18 DIAGNOSIS — M47816 Spondylosis without myelopathy or radiculopathy, lumbar region: Secondary | ICD-10-CM | POA: Diagnosis not present

## 2018-12-23 DIAGNOSIS — Z4802 Encounter for removal of sutures: Secondary | ICD-10-CM | POA: Diagnosis not present

## 2018-12-23 DIAGNOSIS — S0101XD Laceration without foreign body of scalp, subsequent encounter: Secondary | ICD-10-CM | POA: Diagnosis not present

## 2019-01-13 DIAGNOSIS — M47816 Spondylosis without myelopathy or radiculopathy, lumbar region: Secondary | ICD-10-CM | POA: Diagnosis not present

## 2019-01-20 ENCOUNTER — Other Ambulatory Visit: Payer: Self-pay

## 2019-01-20 DIAGNOSIS — Z20822 Contact with and (suspected) exposure to covid-19: Secondary | ICD-10-CM

## 2019-01-22 LAB — NOVEL CORONAVIRUS, NAA: SARS-CoV-2, NAA: NOT DETECTED

## 2019-02-24 DIAGNOSIS — L821 Other seborrheic keratosis: Secondary | ICD-10-CM | POA: Diagnosis not present

## 2019-02-24 DIAGNOSIS — D225 Melanocytic nevi of trunk: Secondary | ICD-10-CM | POA: Diagnosis not present

## 2019-03-22 ENCOUNTER — Ambulatory Visit: Payer: Medicare Other | Attending: Internal Medicine

## 2019-03-22 DIAGNOSIS — Z23 Encounter for immunization: Secondary | ICD-10-CM | POA: Insufficient documentation

## 2019-03-22 NOTE — Progress Notes (Signed)
   Covid-19 Vaccination Clinic  Name:  Alex Franklin    MRN: [id number] DOB: Feb 07, 1941  03/22/2019  Alex Franklin was observed post Covid-19 immunization for 15 minutes without incidence. He was provided with Vaccine Information Sheet and instruction to access the V-Safe system.   Alex Franklin was instructed to call 911 with any severe reactions post vaccine: Marland Kitchen Difficulty breathing  . Swelling of your face and throat  . A fast heartbeat  . A bad rash all over your body  . Dizziness and weakness    Immunizations Administered    Name Date Dose VIS Date Route   Pfizer COVID-19 Vaccine 03/22/2019  1:44 PM 0.3 mL 02/14/2019 Intramuscular   Manufacturer: [employer], [employer]   Lot: [id number]   [place]: [id number]

## 2019-03-25 DIAGNOSIS — I700 Atherosclerosis of aorta: Secondary | ICD-10-CM | POA: Diagnosis not present

## 2019-03-25 DIAGNOSIS — J849 Interstitial pulmonary disease, unspecified: Secondary | ICD-10-CM | POA: Diagnosis not present

## 2019-03-25 DIAGNOSIS — R252 Cramp and spasm: Secondary | ICD-10-CM | POA: Diagnosis not present

## 2019-03-25 DIAGNOSIS — C61 Malignant neoplasm of prostate: Secondary | ICD-10-CM | POA: Diagnosis not present

## 2019-03-25 DIAGNOSIS — M48061 Spinal stenosis, lumbar region without neurogenic claudication: Secondary | ICD-10-CM | POA: Diagnosis not present

## 2019-03-25 DIAGNOSIS — E7800 Pure hypercholesterolemia, unspecified: Secondary | ICD-10-CM | POA: Diagnosis not present

## 2019-03-25 DIAGNOSIS — J440 Chronic obstructive pulmonary disease with acute lower respiratory infection: Secondary | ICD-10-CM | POA: Diagnosis not present

## 2019-03-25 DIAGNOSIS — I10 Essential (primary) hypertension: Secondary | ICD-10-CM | POA: Diagnosis not present

## 2019-04-11 ENCOUNTER — Ambulatory Visit: Payer: PPO | Attending: Internal Medicine

## 2019-04-11 DIAGNOSIS — Z23 Encounter for immunization: Secondary | ICD-10-CM | POA: Insufficient documentation

## 2019-04-11 NOTE — Progress Notes (Signed)
   Covid-19 Vaccination Clinic  Name:  Alex Franklin    MRN: [id number] DOB: October 04, 1940  04/11/2019  [name] was observed post Covid-19 immunization for 15 minutes without incidence. He was provided with Vaccine Information Sheet and instruction to access the V-Safe system.   [name] was instructed to call 911 with any severe reactions post vaccine: Marland Kitchen Difficulty breathing  . Swelling of your face and throat  . A fast heartbeat  . A bad rash all over your body  . Dizziness and weakness    Immunizations Administered    Name Date Dose VIS Date Route   Pfizer COVID-19 Vaccine 04/11/2019  8:47 AM 0.3 mL 02/14/2019 Intramuscular   Manufacturer: [place]   Lot: [id number]   [place]: [id number]

## 2019-10-01 DIAGNOSIS — I10 Essential (primary) hypertension: Secondary | ICD-10-CM | POA: Diagnosis not present

## 2019-10-01 DIAGNOSIS — I700 Atherosclerosis of aorta: Secondary | ICD-10-CM | POA: Diagnosis not present

## 2019-10-01 DIAGNOSIS — J440 Chronic obstructive pulmonary disease with acute lower respiratory infection: Secondary | ICD-10-CM | POA: Diagnosis not present

## 2019-10-01 DIAGNOSIS — Z0000 Encounter for general adult medical examination without abnormal findings: Secondary | ICD-10-CM | POA: Diagnosis not present

## 2019-10-01 DIAGNOSIS — M48061 Spinal stenosis, lumbar region without neurogenic claudication: Secondary | ICD-10-CM | POA: Diagnosis not present

## 2019-10-01 DIAGNOSIS — C61 Malignant neoplasm of prostate: Secondary | ICD-10-CM | POA: Diagnosis not present

## 2019-10-01 DIAGNOSIS — E7800 Pure hypercholesterolemia, unspecified: Secondary | ICD-10-CM | POA: Diagnosis not present

## 2019-10-01 DIAGNOSIS — Z1389 Encounter for screening for other disorder: Secondary | ICD-10-CM | POA: Diagnosis not present

## 2020-01-07 DIAGNOSIS — Z961 Presence of intraocular lens: Secondary | ICD-10-CM | POA: Diagnosis not present

## 2020-01-07 DIAGNOSIS — H5202 Hypermetropia, left eye: Secondary | ICD-10-CM | POA: Diagnosis not present

## 2020-01-07 DIAGNOSIS — H5211 Myopia, right eye: Secondary | ICD-10-CM | POA: Diagnosis not present

## 2020-01-07 DIAGNOSIS — H33031 Retinal detachment with giant retinal tear, right eye: Secondary | ICD-10-CM | POA: Diagnosis not present

## 2020-01-07 DIAGNOSIS — H524 Presbyopia: Secondary | ICD-10-CM | POA: Diagnosis not present

## 2020-01-07 DIAGNOSIS — H52222 Regular astigmatism, left eye: Secondary | ICD-10-CM | POA: Diagnosis not present

## 2020-01-07 DIAGNOSIS — H18053 Posterior corneal pigmentations, bilateral: Secondary | ICD-10-CM | POA: Diagnosis not present

## 2020-03-16 DIAGNOSIS — L218 Other seborrheic dermatitis: Secondary | ICD-10-CM | POA: Diagnosis not present

## 2020-03-16 DIAGNOSIS — L821 Other seborrheic keratosis: Secondary | ICD-10-CM | POA: Diagnosis not present

## 2020-03-16 DIAGNOSIS — L570 Actinic keratosis: Secondary | ICD-10-CM | POA: Diagnosis not present

## 2020-03-16 DIAGNOSIS — D225 Melanocytic nevi of trunk: Secondary | ICD-10-CM | POA: Diagnosis not present

## 2020-03-16 DIAGNOSIS — D1801 Hemangioma of skin and subcutaneous tissue: Secondary | ICD-10-CM | POA: Diagnosis not present

## 2020-04-01 ENCOUNTER — Ambulatory Visit
Admission: RE | Admit: 2020-04-01 | Discharge: 2020-04-01 | Disposition: A | Discharge status: Home / Self Care | Payer: PPO | Source: Ambulatory Visit | Attending: Internal Medicine | Admitting: Internal Medicine

## 2020-04-01 ENCOUNTER — Other Ambulatory Visit: Payer: Self-pay | Admitting: Internal Medicine

## 2020-04-01 DIAGNOSIS — E7800 Pure hypercholesterolemia, unspecified: Secondary | ICD-10-CM | POA: Diagnosis not present

## 2020-04-01 DIAGNOSIS — I10 Essential (primary) hypertension: Secondary | ICD-10-CM | POA: Diagnosis not present

## 2020-04-01 DIAGNOSIS — J440 Chronic obstructive pulmonary disease with acute lower respiratory infection: Secondary | ICD-10-CM

## 2020-04-01 DIAGNOSIS — J449 Chronic obstructive pulmonary disease, unspecified: Secondary | ICD-10-CM | POA: Diagnosis not present

## 2020-04-01 DIAGNOSIS — J209 Acute bronchitis, unspecified: Secondary | ICD-10-CM

## 2020-04-01 DIAGNOSIS — I700 Atherosclerosis of aorta: Secondary | ICD-10-CM | POA: Diagnosis not present

## 2020-04-01 DIAGNOSIS — R059 Cough, unspecified: Secondary | ICD-10-CM | POA: Diagnosis not present

## 2020-04-01 DIAGNOSIS — C61 Malignant neoplasm of prostate: Secondary | ICD-10-CM | POA: Diagnosis not present

## 2020-06-14 DIAGNOSIS — J449 Chronic obstructive pulmonary disease, unspecified: Secondary | ICD-10-CM | POA: Diagnosis not present

## 2020-06-14 DIAGNOSIS — E7800 Pure hypercholesterolemia, unspecified: Secondary | ICD-10-CM | POA: Diagnosis not present

## 2020-06-14 DIAGNOSIS — I10 Essential (primary) hypertension: Secondary | ICD-10-CM | POA: Diagnosis not present

## 2020-06-14 DIAGNOSIS — J440 Chronic obstructive pulmonary disease with acute lower respiratory infection: Secondary | ICD-10-CM | POA: Diagnosis not present

## 2020-09-20 DIAGNOSIS — C61 Malignant neoplasm of prostate: Secondary | ICD-10-CM | POA: Diagnosis not present

## 2020-09-20 DIAGNOSIS — R3129 Other microscopic hematuria: Secondary | ICD-10-CM | POA: Diagnosis not present

## 2020-09-24 ENCOUNTER — Other Ambulatory Visit (HOSPITAL_COMMUNITY): Payer: Self-pay | Admitting: Urology

## 2020-09-24 DIAGNOSIS — C61 Malignant neoplasm of prostate: Secondary | ICD-10-CM

## 2020-10-03 DIAGNOSIS — J440 Chronic obstructive pulmonary disease with acute lower respiratory infection: Secondary | ICD-10-CM | POA: Diagnosis not present

## 2020-10-03 DIAGNOSIS — J449 Chronic obstructive pulmonary disease, unspecified: Secondary | ICD-10-CM | POA: Diagnosis not present

## 2020-10-03 DIAGNOSIS — E7800 Pure hypercholesterolemia, unspecified: Secondary | ICD-10-CM | POA: Diagnosis not present

## 2020-10-03 DIAGNOSIS — I10 Essential (primary) hypertension: Secondary | ICD-10-CM | POA: Diagnosis not present

## 2020-10-13 ENCOUNTER — Other Ambulatory Visit: Payer: Self-pay

## 2020-10-13 ENCOUNTER — Ambulatory Visit (HOSPITAL_COMMUNITY)
Admission: RE | Admit: 2020-10-13 | Discharge: 2020-10-13 | Disposition: A | Discharge status: Home / Self Care | Payer: PPO | Source: Ambulatory Visit | Attending: Urology | Admitting: Urology

## 2020-10-13 DIAGNOSIS — C61 Malignant neoplasm of prostate: Secondary | ICD-10-CM | POA: Insufficient documentation

## 2020-10-13 MED ORDER — PIFLIFOLASTAT F 18 (PYLARIFY) INJECTION
9.0000 | Freq: Once | INTRAVENOUS | Status: AC
  Administered 2020-10-13: 9 via INTRAVENOUS

## 2020-10-14 NOTE — Progress Notes (Signed)
GU Location of Tumor / Histology:  Adenocarcinoma of the prostate  If Prostate Cancer, Gleason Score is (3 + 4), PSA (0.53 as of 09/20/2020), and Prostate volume ()  Ali Lowe T Pfenning presented with signs/symptoms of:  (09/20/2020: Last visit with Dr. Ellison Hughs)   Biopsies revealed:  11/28/2001   Past/Anticipated interventions by urology, if any:  09/20/2020 Dr. Harrell Gave Lovena Neighbours   11/28/2001 ([place]) Perineal prostatectomy   Past/Anticipated interventions by medical oncology, if any:  No referral placed at this time  Weight changes, if any: no   IPSS Score: 2 SHIM Score:0  Bowel/Bladder complaints, if any: none   Nausea/Vomiting, if any: no  Pain issues, if any:  chronic back pain  SAFETY ISSUES: Prior radiation? no Pacemaker/ICD? no Possible current pregnancy? N/A Is the patient on methotrexate? no  Current Complaints / other details:  Patient without any complaints or issues at this time.

## 2020-10-19 ENCOUNTER — Encounter: Payer: Self-pay | Admitting: Radiation Oncology

## 2020-10-19 ENCOUNTER — Ambulatory Visit
Admission: RE | Admit: 2020-10-19 | Discharge: 2020-10-19 | Disposition: A | Discharge status: Home / Self Care | Payer: PPO | Source: Ambulatory Visit | Attending: Radiation Oncology | Admitting: Radiation Oncology

## 2020-10-19 ENCOUNTER — Other Ambulatory Visit: Payer: Self-pay

## 2020-10-19 DIAGNOSIS — C61 Malignant neoplasm of prostate: Secondary | ICD-10-CM | POA: Diagnosis not present

## 2020-10-19 HISTORY — DX: Malignant neoplasm of prostate: C61

## 2020-10-19 NOTE — Progress Notes (Signed)
Radiation Oncology         (336) [phone] ________________________________  Initial Outpatient Consultation - Conducted via telephone due to current COVID-19 concerns for limiting patient exposure  Name: Alex Franklin MRN: [id number]  Date: 10/19/2020  DOB: 08-29-1940  [id number], Denton Ar, MD  Davis Gourd*   REFERRING PHYSICIAN: Davis Gourd*  DIAGNOSIS: 80 y.o. gentleman with locally recurrent prostate cancer with a rising PSA of 0.53 s/p RPP 11/2001 for Stage pT2c,Nx, Gleason 3+4 prostate cancer    ICD-10-CM   1. Prostate cancer ([place])  C61       HISTORY OF PRESENT ILLNESS: Alex Franklin is a 80 y.o. male with a diagnosis of recurrent prostate cancer. He was initially diagnosed with prostate cancer on 11/28/01 by Dr. Risa Grill with a PSA of 4.5 at the time of diagnosis. He opted to proceed with a radical perineal prostatectomy on 01/15/02 under the care of Dr. Percell Miller at [facility].  Final surgical pathology revealed Gleason 3+4 prostatic adenocarcinoma with no high risk features, only perineural invasion present. No lymph nodes were removed. His postoperative PSA was undetectable.  His PSA remained undetectable until 01/2014 when it was noted very low detectable at 0.06. It slowly increased to 0.13 in 07/2015, 0.15 in 12/2016, and .22 in 08/2017.  He was lost to follow-up thereafter until 09/2020 when he established care with Dr. Lovena Neighbours.  His PSA was noted further elevated at 0.53 on 09/20/2020. This prompted a PSMA scan which was performed on 10/13/20 showing focal activity in the inferior right aspect of the prostatectomy bed, concerning for local prostate cancer recurrence but no evidence of metastatic adenopathy, visceral disease, or skeletal metastasis.  The patient reviewed the biopsy results with his urologist and he has kindly been referred today for discussion of potential radiation treatment options.   PREVIOUS RADIATION THERAPY: No  PAST MEDICAL HISTORY:  Past  Medical History:  Diagnosis Date   Arthritis    GERD (gastroesophageal reflux disease)    Hypertension    Prostate cancer ([place]) 10/19/2020      PAST SURGICAL HISTORY: Past Surgical History:  Procedure Laterality Date   COLONOSCOPY WITH PROPOFOL N/A 06/07/2015   Procedure: COLONOSCOPY WITH PROPOFOL;  Surgeon: Garlan Fair, MD;  Location: [facility];  Service: Endoscopy;  Laterality: N/A;   EYE SURGERY     bilateral cataract surgery with lens implants   EYE SURGERY     detached retina    prostectomy      FAMILY HISTORY: No family history on file.  SOCIAL HISTORY:  Social History   Socioeconomic History   Marital status: Married    Spouse name: Not on file   Number of children: Not on file   Years of education: Not on file   Highest education level: Not on file  Occupational History   Not on file  Tobacco Use   Smoking status: Former    Types: Cigarettes    Quit date: 07/13/2013    Years since quitting: 7.2   Smokeless tobacco: Not on file  Substance and Sexual Activity   Alcohol use: Yes   Drug use: No   Sexual activity: Not on file  Other Topics Concern   Not on file  Social History Narrative   Not on file   Social Determinants of Health   Financial Resource Strain: Not on file  Food Insecurity: Not on file  Transportation Needs: Not on file  Physical Activity: Not on file  Stress: Not on file  Social  Connections: Not on file  Intimate Partner Violence: Not on file    ALLERGIES: Patient has no known allergies.  MEDICATIONS:  Current Outpatient Medications  Medication Sig Dispense Refill   amLODipine (NORVASC) 10 MG tablet Take 10 mg by mouth every morning.     aspirin EC 81 MG tablet Take 81 mg by mouth every morning.     benazepril (LOTENSIN) 40 MG tablet Take 40 mg by mouth every morning.     cholecalciferol (VITAMIN D) 1000 units tablet Take 1,000 Units by mouth daily.     losartan (COZAAR) 50 MG tablet Take 50 mg by mouth daily.     Omega-3  Fatty Acids (FISH OIL) 1200 MG CAPS Take 1,200 mg by mouth daily.     simvastatin (ZOCOR) 20 MG tablet Take 20 mg by mouth every morning.     SYMBICORT 80-4.5 MCG/ACT inhaler SMARTSIG:2 Puff(s) Via Inhaler Daily     No current facility-administered medications for this encounter.    REVIEW OF SYSTEMS:  On review of systems, the patient reports that he is doing well overall. He denies any chest pain, shortness of breath, cough, fevers, chills, night sweats.  He had noticed approximately 8 pound weight loss, unintentional, over the past several months.  He denies any bowel disturbances, and denies abdominal pain, nausea or vomiting. He denies any new musculoskeletal or joint aches or pains. His IPSS was 2, indicating mild urinary symptoms. 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 A complete review of systems is obtained and is otherwise negative.    PHYSICAL EXAM:  Wt Readings from Last 3 Encounters:  06/07/15 170 lb (77.1 kg)   Temp Readings from Last 3 Encounters:  06/07/15 (!) 96.8 F (36 C) (Axillary)   BP Readings from Last 3 Encounters:  06/07/15 127/67   Pulse Readings from Last 3 Encounters:  06/07/15 61   Pain Assessment Pain Score: 3  Pain Frequency: Intermittent Pain Loc: Back/10  Unable to assess due to telephone consult visit format.   KPS = 90  100 - Normal; no complaints; no evidence of disease. 90   - Able to carry on normal activity; minor signs or symptoms of disease. 80   - Normal activity with effort; some signs or symptoms of disease. 96   - Cares for self; unable to carry on normal activity or to do active work. 60   - Requires occasional assistance, but is able to care for most of his personal needs. 50   - Requires considerable assistance and frequent medical care. 72   - Disabled; requires special care and assistance. 74   - Severely disabled; hospital admission is indicated although death not imminent. 44   - Very  sick; hospital admission necessary; active supportive treatment necessary. 10   - Moribund; fatal processes progressing rapidly. 0     - Dead  Karnofsky DA, Abelmann Bertrand, Craver LS and Burchenal [facility] [phone]) The use of the nitrogen mustards in the palliative treatment of carcinoma: with particular reference to bronchogenic carcinoma Cancer 1 634-56  LABORATORY DATA:  Lab Results  Component Value Date   WBC 11.6 (H) 10/19/2008   HGB 16.7 10/19/2008   HCT 49.0 10/19/2008   MCV 99.4 10/19/2008   PLT 315 10/19/2008   Lab Results  Component Value Date   NA 132 (L) 10/19/2008   K 3.8 10/19/2008   CL 97 10/19/2008   No results found for: ALT, AST, GGT, ALKPHOS, BILITOT   RADIOGRAPHY:  NM PET (PSMA) SKULL TO MID THIGH  Result Date: 10/15/2020 CLINICAL DATA:  Prostate carcinoma with biochemical recurrence. EXAM: NUCLEAR MEDICINE PET SKULL BASE TO THIGH TECHNIQUE: 9.2 mCi F18 Piflufolastat (Pylarify) was injected intravenously. Full-ring PET imaging was performed from the skull base to thigh after the radiotracer. CT data was obtained and used for attenuation correction and anatomic localization. COMPARISON:  Chest CT 02/11/2009 FINDINGS: NECK No radiotracer activity in neck lymph nodes. Incidental CT finding: None CHEST No radiotracer accumulation within mediastinal or hilar lymph nodes. Incidental CT finding: 6 mm nodule in the RIGHT upper lobe (image 99/4) does not have radiotracer activity. Additionally this nodules present on CT from 02/11/2009. Several small nodules in the RIGHT upper lobe less than 5 mm (image 78/4 and image 81/4) are not present on comparison exam. Within the LEFT lower lobe 7 mm nodule image 117/4 appears increased from 3 mm on prior. Adjacent 4 mm nodule image 121/4 is unchanged from prior. Again these small nodules do not have radiotracer activity ABDOMEN/PELVIS Prostate: Focus of radiotracer activity within the posterior inferior RIGHT aspect of the prostatectomy bed with SUV  max equal 10.1. There is no measurable lesion on the CT portion exam. This activity is directly posterior to the surgical clips and estimated diameter of 8 mm. Lymph nodes: No abnormal radiotracer accumulation within pelvic or abdominal nodes. Liver: No evidence of liver metastasis Incidental CT finding: Atherosclerotic calcification of the aorta. Benign LEFT renal cysts. SKELETON No focal  activity to suggest skeletal metastasis. IMPRESSION: 1. Focal activity in the inferior RIGHT aspect of the prostatectomy bed is concerning for local prostate cancer recurrence. 2. No evidence of metastatic adenopathy in the pelvis or periaortic retroperitoneum. 3. No evidence of visceral disease or skeletal metastasis. 4. Bilateral pulmonary nodules without radiotracer activity. Some nodules are present as far back as 2010. Several nodules have increased in size while others are stable. Favor benign noncalcified granulomas. Electronically Signed   By: Suzy Bouchard M.D.   On: 10/15/2020 09:45      IMPRESSION/PLAN: This visit was conducted via telephone to spare the patient unnecessary potential exposure in the healthcare setting during the current COVID-19 pandemic. 1. 80 y.o. gentleman with locally recurrent prostate cancer with a rising PSA of 0.53 s/p RPP 11/2001 for Stage pT2c,Nx, Gleason 3+4 prostate cancer. Today, we reviewed the findings and workup thus far.  We discussed the natural history of prostate cancer.  We reviewed the the implications of positive margins, extracapsular extension, and seminal vesicle involvement on the risk of prostate cancer recurrence. In his case, only perineural invasion was present on surgical pathology but he has had a steady, gradual rise in the PSA since 2015 and recent PSMA PET scan shows evidence of local recurrence in the prostatectomy bed.  We reviewed some of the evidence suggesting an advantage for patients who undergo salvage radiotherapy in this setting in terms of disease  control and overall survival. We discussed radiation treatment directed to the prostatic fossa with regard to the logistics and delivery of daily external beam radiation treatment.  We also discussed the potential role of ST-ADT concurrent with radiotherapy in the management of locally recurrent disease and discussed the expected acute and late side effects associated with this treatment.  He was encouraged to ask questions that were answered to his stated satisfaction.  At the conclusion of our conversation, the patient is interested in moving forward with the recommended 7.5 week course of daily salvage external beam therapy without the  use of ADT. We will share our discussion with Dr. Jeffie Pollock and proceed with treatment planning accordingly. The patient appears to have a good understanding of his disease and our treatment recommendations which are of curative intent and is in agreement with the stated plan.  He is currently out of town and will not be returning until October 1 so we have tentatively scheduled him for CT simulation on Tuesday, October 4 at 3 PM in anticipation of beginning IMRT on 12/13/20 in an effort to complete his course of radiation prior to him leaving to go back out of town the week of Thanksgiving.  We enjoyed meeting him today and look forward to continuing to participate in his care.   Given current concerns for patient exposure during the COVID-19 pandemic, this encounter was conducted via telephone. The patient was notified in advance and was offered a [place] meeting to allow for face to face communication but unfortunately reported that he did not have the appropriate resources/technology to support such a visit and instead preferred to proceed with telephone consult. The patient has given verbal consent for this type of encounter. The attendants for this meeting include Tyler Pita MD, Ashlyn Bruning PA-C, [facility], and patient, Alex Franklin. During the  encounter, Tyler Pita MD, Ashlyn Bruning PA-C, and scribe, Wilburn Mylar were located at [place].  Patient, Alex Franklin was located at home.   We personally spent 60 minutes in this encounter including chart review, reviewing radiological studies, meeting face-to-face with the patient, entering orders and completing documentation.    Nicholos Johns, PA-C    Tyler Pita, MD  [facility] Direct Dial: [phone]  Fax: [phone] [email or web address]  Skype  [email or web address]   This document serves as a record of services personally performed by Tyler Pita, MD and Freeman Caldron, PA-C. It was created on their behalf by Wilburn Mylar, a trained medical scribe. The creation of this record is based on the scribe's personal observations and the provider's statements to them. This document has been checked and approved by the attending provider.

## 2020-12-07 ENCOUNTER — Other Ambulatory Visit: Payer: Self-pay

## 2020-12-07 ENCOUNTER — Ambulatory Visit
Admission: RE | Admit: 2020-12-07 | Discharge: 2020-12-07 | Disposition: A | Discharge status: Home / Self Care | Payer: PPO | Source: Ambulatory Visit | Attending: Radiation Oncology | Admitting: Radiation Oncology

## 2020-12-07 DIAGNOSIS — C61 Malignant neoplasm of prostate: Secondary | ICD-10-CM | POA: Diagnosis not present

## 2020-12-07 DIAGNOSIS — Z510 Encounter for antineoplastic radiation therapy: Secondary | ICD-10-CM | POA: Diagnosis not present

## 2020-12-07 NOTE — Progress Notes (Signed)
  Radiation Oncology         (336) [phone] ________________________________  Name: Alex Franklin MRN: [id number]  Date: 12/07/2020  DOB: 03-11-1940  SIMULATION AND TREATMENT PLANNING NOTE    ICD-10-CM   1. Malignant neoplasm of prostate ([place])  C61       DIAGNOSIS:  80 y.o. gentleman with locally recurrent prostate cancer with a rising PSA of 0.53 s/p RPP 11/2001 for Stage pT2c,Nx, Gleason 3+4 prostate cancer  NARRATIVE:  The patient was brought to the [facility].  Identity was confirmed.  All relevant records and images related to the planned course of therapy were reviewed.  The patient freely provided informed written consent to proceed with treatment after reviewing the details related to the planned course of therapy. The consent form was witnessed and verified by the simulation staff.  Then, the patient was set-up in a stable reproducible supine position for radiation therapy.  A vacuum lock pillow device was custom fabricated to position his legs in a reproducible immobilized position.  Then, I performed a urethrogram under sterile conditions to identify the prostatic apex.  CT images were obtained.  Surface markings were placed.  The CT images were loaded into the planning software.  Then the prostate target and avoidance structures including the rectum, bladder, bowel and hips were contoured.  Treatment planning then occurred.  The radiation prescription was entered and confirmed.  A total of one complex treatment devices was fabricated. I have requested : Intensity Modulated Radiotherapy (IMRT) is medically necessary for this case for the following reason:  Rectal sparing.Marland Kitchen  PLAN:  The patient will receive 66 Gy in 33 fractions.  ________________________________  Sheral Apley Tammi Klippel, M.D.

## 2020-12-08 DIAGNOSIS — C61 Malignant neoplasm of prostate: Secondary | ICD-10-CM | POA: Diagnosis not present

## 2020-12-08 DIAGNOSIS — Z510 Encounter for antineoplastic radiation therapy: Secondary | ICD-10-CM | POA: Diagnosis not present

## 2020-12-13 ENCOUNTER — Ambulatory Visit: Payer: PPO

## 2020-12-14 ENCOUNTER — Ambulatory Visit: Payer: PPO

## 2020-12-15 ENCOUNTER — Ambulatory Visit: Payer: PPO

## 2020-12-16 ENCOUNTER — Ambulatory Visit: Payer: PPO

## 2020-12-16 ENCOUNTER — Other Ambulatory Visit: Payer: Self-pay

## 2020-12-16 ENCOUNTER — Ambulatory Visit
Admission: RE | Admit: 2020-12-16 | Discharge: 2020-12-16 | Disposition: A | Discharge status: Home / Self Care | Payer: PPO | Source: Ambulatory Visit | Attending: Radiation Oncology | Admitting: Radiation Oncology

## 2020-12-16 DIAGNOSIS — Z510 Encounter for antineoplastic radiation therapy: Secondary | ICD-10-CM | POA: Diagnosis not present

## 2020-12-16 DIAGNOSIS — C61 Malignant neoplasm of prostate: Secondary | ICD-10-CM | POA: Diagnosis not present

## 2020-12-16 DIAGNOSIS — Z20822 Contact with and (suspected) exposure to covid-19: Secondary | ICD-10-CM | POA: Diagnosis not present

## 2020-12-17 ENCOUNTER — Ambulatory Visit
Admission: RE | Admit: 2020-12-17 | Discharge: 2020-12-17 | Disposition: A | Discharge status: Home / Self Care | Payer: PPO | Source: Ambulatory Visit | Attending: Radiation Oncology | Admitting: Radiation Oncology

## 2020-12-17 DIAGNOSIS — C61 Malignant neoplasm of prostate: Secondary | ICD-10-CM | POA: Diagnosis not present

## 2020-12-17 DIAGNOSIS — Z510 Encounter for antineoplastic radiation therapy: Secondary | ICD-10-CM | POA: Diagnosis not present

## 2020-12-17 NOTE — Progress Notes (Signed)
Pt here for patient teaching.    Pt given Radiation and You booklet and skin care instructions.    Reviewed areas of pertinence such as diarrhea, fatigue, hair loss, nausea and vomiting, sexual and fertility changes, skin changes, and urinary and bladder changes .   Pt able to give teach back of to pat skin, use unscented/gentle soap, use baby wipes, have Imodium on hand, drink plenty of water, and sitz bath,avoid applying anything to skin within 4 hours of treatment.   Pt verbalizes understanding of information given and will contact nursing with any questions or concerns.    [email or web address].[email or web address]/whattoexpect/index             

## 2020-12-20 ENCOUNTER — Other Ambulatory Visit: Payer: Self-pay

## 2020-12-20 ENCOUNTER — Ambulatory Visit
Admission: RE | Admit: 2020-12-20 | Discharge: 2020-12-20 | Disposition: A | Discharge status: Home / Self Care | Payer: PPO | Source: Ambulatory Visit | Attending: Radiation Oncology | Admitting: Radiation Oncology

## 2020-12-20 DIAGNOSIS — Z510 Encounter for antineoplastic radiation therapy: Secondary | ICD-10-CM | POA: Diagnosis not present

## 2020-12-20 DIAGNOSIS — C61 Malignant neoplasm of prostate: Secondary | ICD-10-CM | POA: Diagnosis not present

## 2020-12-21 ENCOUNTER — Ambulatory Visit
Admission: RE | Admit: 2020-12-21 | Discharge: 2020-12-21 | Disposition: A | Discharge status: Home / Self Care | Payer: PPO | Source: Ambulatory Visit | Attending: Radiation Oncology | Admitting: Radiation Oncology

## 2020-12-21 DIAGNOSIS — C61 Malignant neoplasm of prostate: Secondary | ICD-10-CM | POA: Diagnosis not present

## 2020-12-21 DIAGNOSIS — Z510 Encounter for antineoplastic radiation therapy: Secondary | ICD-10-CM | POA: Diagnosis not present

## 2020-12-22 ENCOUNTER — Other Ambulatory Visit: Payer: Self-pay

## 2020-12-22 ENCOUNTER — Ambulatory Visit
Admission: RE | Admit: 2020-12-22 | Discharge: 2020-12-22 | Disposition: A | Discharge status: Home / Self Care | Payer: PPO | Source: Ambulatory Visit | Attending: Radiation Oncology | Admitting: Radiation Oncology

## 2020-12-22 DIAGNOSIS — Z510 Encounter for antineoplastic radiation therapy: Secondary | ICD-10-CM | POA: Diagnosis not present

## 2020-12-22 DIAGNOSIS — C61 Malignant neoplasm of prostate: Secondary | ICD-10-CM | POA: Diagnosis not present

## 2020-12-23 ENCOUNTER — Other Ambulatory Visit: Payer: Self-pay

## 2020-12-23 ENCOUNTER — Ambulatory Visit
Admission: RE | Admit: 2020-12-23 | Discharge: 2020-12-23 | Disposition: A | Discharge status: Home / Self Care | Payer: PPO | Source: Ambulatory Visit | Attending: Radiation Oncology | Admitting: Radiation Oncology

## 2020-12-23 DIAGNOSIS — C61 Malignant neoplasm of prostate: Secondary | ICD-10-CM | POA: Diagnosis not present

## 2020-12-23 DIAGNOSIS — Z510 Encounter for antineoplastic radiation therapy: Secondary | ICD-10-CM | POA: Diagnosis not present

## 2020-12-24 ENCOUNTER — Ambulatory Visit
Admission: RE | Admit: 2020-12-24 | Discharge: 2020-12-24 | Disposition: A | Discharge status: Home / Self Care | Payer: PPO | Source: Ambulatory Visit | Attending: Radiation Oncology | Admitting: Radiation Oncology

## 2020-12-24 DIAGNOSIS — Z510 Encounter for antineoplastic radiation therapy: Secondary | ICD-10-CM | POA: Diagnosis not present

## 2020-12-24 DIAGNOSIS — C61 Malignant neoplasm of prostate: Secondary | ICD-10-CM | POA: Diagnosis not present

## 2020-12-27 ENCOUNTER — Other Ambulatory Visit: Payer: Self-pay

## 2020-12-27 ENCOUNTER — Ambulatory Visit
Admission: RE | Admit: 2020-12-27 | Discharge: 2020-12-27 | Disposition: A | Discharge status: Home / Self Care | Payer: PPO | Source: Ambulatory Visit | Attending: Radiation Oncology | Admitting: Radiation Oncology

## 2020-12-27 DIAGNOSIS — J440 Chronic obstructive pulmonary disease with acute lower respiratory infection: Secondary | ICD-10-CM | POA: Diagnosis not present

## 2020-12-27 DIAGNOSIS — C61 Malignant neoplasm of prostate: Secondary | ICD-10-CM | POA: Diagnosis not present

## 2020-12-27 DIAGNOSIS — I10 Essential (primary) hypertension: Secondary | ICD-10-CM | POA: Diagnosis not present

## 2020-12-27 DIAGNOSIS — Z510 Encounter for antineoplastic radiation therapy: Secondary | ICD-10-CM | POA: Diagnosis not present

## 2020-12-27 DIAGNOSIS — E7800 Pure hypercholesterolemia, unspecified: Secondary | ICD-10-CM | POA: Diagnosis not present

## 2020-12-27 DIAGNOSIS — J449 Chronic obstructive pulmonary disease, unspecified: Secondary | ICD-10-CM | POA: Diagnosis not present

## 2020-12-28 ENCOUNTER — Ambulatory Visit
Admission: RE | Admit: 2020-12-28 | Discharge: 2020-12-28 | Disposition: A | Discharge status: Home / Self Care | Payer: PPO | Source: Ambulatory Visit | Attending: Radiation Oncology | Admitting: Radiation Oncology

## 2020-12-28 DIAGNOSIS — Z510 Encounter for antineoplastic radiation therapy: Secondary | ICD-10-CM | POA: Diagnosis not present

## 2020-12-28 DIAGNOSIS — C61 Malignant neoplasm of prostate: Secondary | ICD-10-CM | POA: Diagnosis not present

## 2020-12-29 ENCOUNTER — Ambulatory Visit
Admission: RE | Admit: 2020-12-29 | Discharge: 2020-12-29 | Disposition: A | Discharge status: Home / Self Care | Payer: PPO | Source: Ambulatory Visit | Attending: Radiation Oncology | Admitting: Radiation Oncology

## 2020-12-29 DIAGNOSIS — C61 Malignant neoplasm of prostate: Secondary | ICD-10-CM | POA: Diagnosis not present

## 2020-12-29 DIAGNOSIS — Z510 Encounter for antineoplastic radiation therapy: Secondary | ICD-10-CM | POA: Diagnosis not present

## 2020-12-30 ENCOUNTER — Other Ambulatory Visit: Payer: Self-pay

## 2020-12-30 ENCOUNTER — Ambulatory Visit
Admission: RE | Admit: 2020-12-30 | Discharge: 2020-12-30 | Disposition: A | Discharge status: Home / Self Care | Payer: PPO | Source: Ambulatory Visit | Attending: Radiation Oncology | Admitting: Radiation Oncology

## 2020-12-30 DIAGNOSIS — Z0000 Encounter for general adult medical examination without abnormal findings: Secondary | ICD-10-CM | POA: Diagnosis not present

## 2020-12-30 DIAGNOSIS — I700 Atherosclerosis of aorta: Secondary | ICD-10-CM | POA: Diagnosis not present

## 2020-12-30 DIAGNOSIS — I10 Essential (primary) hypertension: Secondary | ICD-10-CM | POA: Diagnosis not present

## 2020-12-30 DIAGNOSIS — E7800 Pure hypercholesterolemia, unspecified: Secondary | ICD-10-CM | POA: Diagnosis not present

## 2020-12-30 DIAGNOSIS — Z510 Encounter for antineoplastic radiation therapy: Secondary | ICD-10-CM | POA: Diagnosis not present

## 2020-12-30 DIAGNOSIS — C61 Malignant neoplasm of prostate: Secondary | ICD-10-CM | POA: Diagnosis not present

## 2020-12-30 DIAGNOSIS — E871 Hypo-osmolality and hyponatremia: Secondary | ICD-10-CM | POA: Diagnosis not present

## 2020-12-30 DIAGNOSIS — J849 Interstitial pulmonary disease, unspecified: Secondary | ICD-10-CM | POA: Diagnosis not present

## 2020-12-30 DIAGNOSIS — Z1389 Encounter for screening for other disorder: Secondary | ICD-10-CM | POA: Diagnosis not present

## 2020-12-30 DIAGNOSIS — J440 Chronic obstructive pulmonary disease with acute lower respiratory infection: Secondary | ICD-10-CM | POA: Diagnosis not present

## 2020-12-31 ENCOUNTER — Ambulatory Visit
Admission: RE | Admit: 2020-12-31 | Discharge: 2020-12-31 | Disposition: A | Discharge status: Home / Self Care | Payer: PPO | Source: Ambulatory Visit | Attending: Radiation Oncology | Admitting: Radiation Oncology

## 2020-12-31 ENCOUNTER — Other Ambulatory Visit: Payer: Self-pay

## 2020-12-31 DIAGNOSIS — C61 Malignant neoplasm of prostate: Secondary | ICD-10-CM | POA: Diagnosis not present

## 2020-12-31 DIAGNOSIS — Z510 Encounter for antineoplastic radiation therapy: Secondary | ICD-10-CM | POA: Diagnosis not present

## 2021-01-03 ENCOUNTER — Ambulatory Visit
Admission: RE | Admit: 2021-01-03 | Discharge: 2021-01-03 | Disposition: A | Discharge status: Home / Self Care | Payer: PPO | Source: Ambulatory Visit | Attending: Radiation Oncology | Admitting: Radiation Oncology

## 2021-01-03 ENCOUNTER — Other Ambulatory Visit: Payer: Self-pay

## 2021-01-03 DIAGNOSIS — Z510 Encounter for antineoplastic radiation therapy: Secondary | ICD-10-CM | POA: Diagnosis not present

## 2021-01-03 DIAGNOSIS — C61 Malignant neoplasm of prostate: Secondary | ICD-10-CM | POA: Diagnosis not present

## 2021-01-04 ENCOUNTER — Other Ambulatory Visit: Payer: Self-pay

## 2021-01-04 ENCOUNTER — Ambulatory Visit
Admission: RE | Admit: 2021-01-04 | Discharge: 2021-01-04 | Disposition: A | Discharge status: Home / Self Care | Payer: PPO | Source: Ambulatory Visit | Attending: Radiation Oncology | Admitting: Radiation Oncology

## 2021-01-04 DIAGNOSIS — Z510 Encounter for antineoplastic radiation therapy: Secondary | ICD-10-CM | POA: Diagnosis not present

## 2021-01-04 DIAGNOSIS — C61 Malignant neoplasm of prostate: Secondary | ICD-10-CM | POA: Insufficient documentation

## 2021-01-05 ENCOUNTER — Ambulatory Visit
Admission: RE | Admit: 2021-01-05 | Discharge: 2021-01-05 | Disposition: A | Discharge status: Home / Self Care | Payer: PPO | Source: Ambulatory Visit | Attending: Radiation Oncology | Admitting: Radiation Oncology

## 2021-01-05 DIAGNOSIS — C61 Malignant neoplasm of prostate: Secondary | ICD-10-CM | POA: Diagnosis not present

## 2021-01-05 DIAGNOSIS — Z510 Encounter for antineoplastic radiation therapy: Secondary | ICD-10-CM | POA: Diagnosis not present

## 2021-01-06 ENCOUNTER — Ambulatory Visit
Admission: RE | Admit: 2021-01-06 | Discharge: 2021-01-06 | Disposition: A | Discharge status: Home / Self Care | Payer: PPO | Source: Ambulatory Visit | Attending: Radiation Oncology | Admitting: Radiation Oncology

## 2021-01-06 ENCOUNTER — Other Ambulatory Visit: Payer: Self-pay

## 2021-01-06 DIAGNOSIS — C61 Malignant neoplasm of prostate: Secondary | ICD-10-CM | POA: Diagnosis not present

## 2021-01-06 DIAGNOSIS — Z510 Encounter for antineoplastic radiation therapy: Secondary | ICD-10-CM | POA: Diagnosis not present

## 2021-01-07 ENCOUNTER — Ambulatory Visit
Admission: RE | Admit: 2021-01-07 | Discharge: 2021-01-07 | Disposition: A | Discharge status: Home / Self Care | Payer: PPO | Source: Ambulatory Visit | Attending: Radiation Oncology | Admitting: Radiation Oncology

## 2021-01-07 DIAGNOSIS — C61 Malignant neoplasm of prostate: Secondary | ICD-10-CM | POA: Diagnosis not present

## 2021-01-07 DIAGNOSIS — Z510 Encounter for antineoplastic radiation therapy: Secondary | ICD-10-CM | POA: Diagnosis not present

## 2021-01-10 ENCOUNTER — Ambulatory Visit
Admission: RE | Admit: 2021-01-10 | Discharge: 2021-01-10 | Disposition: A | Discharge status: Home / Self Care | Payer: PPO | Source: Ambulatory Visit | Attending: Radiation Oncology | Admitting: Radiation Oncology

## 2021-01-10 ENCOUNTER — Other Ambulatory Visit: Payer: Self-pay

## 2021-01-10 DIAGNOSIS — C61 Malignant neoplasm of prostate: Secondary | ICD-10-CM | POA: Diagnosis not present

## 2021-01-10 DIAGNOSIS — Z510 Encounter for antineoplastic radiation therapy: Secondary | ICD-10-CM | POA: Diagnosis not present

## 2021-01-11 ENCOUNTER — Ambulatory Visit
Admission: RE | Admit: 2021-01-11 | Discharge: 2021-01-11 | Disposition: A | Discharge status: Home / Self Care | Payer: PPO | Source: Ambulatory Visit | Attending: Radiation Oncology | Admitting: Radiation Oncology

## 2021-01-11 DIAGNOSIS — Z510 Encounter for antineoplastic radiation therapy: Secondary | ICD-10-CM | POA: Diagnosis not present

## 2021-01-11 DIAGNOSIS — C61 Malignant neoplasm of prostate: Secondary | ICD-10-CM | POA: Diagnosis not present

## 2021-01-12 ENCOUNTER — Ambulatory Visit
Admission: RE | Admit: 2021-01-12 | Discharge: 2021-01-12 | Disposition: A | Discharge status: Home / Self Care | Payer: PPO | Source: Ambulatory Visit | Attending: Radiation Oncology | Admitting: Radiation Oncology

## 2021-01-12 ENCOUNTER — Other Ambulatory Visit: Payer: Self-pay

## 2021-01-12 DIAGNOSIS — Z510 Encounter for antineoplastic radiation therapy: Secondary | ICD-10-CM | POA: Diagnosis not present

## 2021-01-12 DIAGNOSIS — C61 Malignant neoplasm of prostate: Secondary | ICD-10-CM | POA: Diagnosis not present

## 2021-01-13 ENCOUNTER — Ambulatory Visit
Admission: RE | Admit: 2021-01-13 | Discharge: 2021-01-13 | Disposition: A | Discharge status: Home / Self Care | Payer: PPO | Source: Ambulatory Visit | Attending: Radiation Oncology | Admitting: Radiation Oncology

## 2021-01-13 DIAGNOSIS — Z510 Encounter for antineoplastic radiation therapy: Secondary | ICD-10-CM | POA: Diagnosis not present

## 2021-01-13 DIAGNOSIS — C61 Malignant neoplasm of prostate: Secondary | ICD-10-CM | POA: Diagnosis not present

## 2021-01-14 ENCOUNTER — Ambulatory Visit
Admission: RE | Admit: 2021-01-14 | Discharge: 2021-01-14 | Disposition: A | Discharge status: Home / Self Care | Payer: PPO | Source: Ambulatory Visit | Attending: Radiation Oncology | Admitting: Radiation Oncology

## 2021-01-14 ENCOUNTER — Other Ambulatory Visit: Payer: Self-pay

## 2021-01-14 DIAGNOSIS — C61 Malignant neoplasm of prostate: Secondary | ICD-10-CM | POA: Diagnosis not present

## 2021-01-14 DIAGNOSIS — Z510 Encounter for antineoplastic radiation therapy: Secondary | ICD-10-CM | POA: Diagnosis not present

## 2021-01-17 ENCOUNTER — Ambulatory Visit
Admission: RE | Admit: 2021-01-17 | Discharge: 2021-01-17 | Disposition: A | Discharge status: Home / Self Care | Payer: PPO | Source: Ambulatory Visit | Attending: Radiation Oncology | Admitting: Radiation Oncology

## 2021-01-17 ENCOUNTER — Other Ambulatory Visit: Payer: Self-pay

## 2021-01-17 DIAGNOSIS — C61 Malignant neoplasm of prostate: Secondary | ICD-10-CM | POA: Diagnosis not present

## 2021-01-17 DIAGNOSIS — Z510 Encounter for antineoplastic radiation therapy: Secondary | ICD-10-CM | POA: Diagnosis not present

## 2021-01-18 ENCOUNTER — Ambulatory Visit: Payer: PPO

## 2021-01-18 ENCOUNTER — Other Ambulatory Visit: Payer: Self-pay

## 2021-01-18 ENCOUNTER — Ambulatory Visit
Admission: RE | Admit: 2021-01-18 | Discharge: 2021-01-18 | Disposition: A | Discharge status: Home / Self Care | Payer: PPO | Source: Ambulatory Visit | Attending: Radiation Oncology | Admitting: Radiation Oncology

## 2021-01-18 DIAGNOSIS — C61 Malignant neoplasm of prostate: Secondary | ICD-10-CM | POA: Diagnosis not present

## 2021-01-18 DIAGNOSIS — Z510 Encounter for antineoplastic radiation therapy: Secondary | ICD-10-CM | POA: Diagnosis not present

## 2021-01-19 ENCOUNTER — Other Ambulatory Visit: Payer: Self-pay

## 2021-01-19 ENCOUNTER — Ambulatory Visit: Payer: PPO

## 2021-01-19 ENCOUNTER — Ambulatory Visit
Admission: RE | Admit: 2021-01-19 | Discharge: 2021-01-19 | Disposition: A | Discharge status: Home / Self Care | Payer: PPO | Source: Ambulatory Visit | Attending: Radiation Oncology | Admitting: Radiation Oncology

## 2021-01-19 DIAGNOSIS — Z510 Encounter for antineoplastic radiation therapy: Secondary | ICD-10-CM | POA: Diagnosis not present

## 2021-01-19 DIAGNOSIS — C61 Malignant neoplasm of prostate: Secondary | ICD-10-CM | POA: Diagnosis not present

## 2021-01-20 ENCOUNTER — Ambulatory Visit
Admission: RE | Admit: 2021-01-20 | Discharge: 2021-01-20 | Disposition: A | Discharge status: Home / Self Care | Payer: PPO | Source: Ambulatory Visit | Attending: Radiation Oncology | Admitting: Radiation Oncology

## 2021-01-20 ENCOUNTER — Ambulatory Visit: Payer: PPO

## 2021-01-20 DIAGNOSIS — C61 Malignant neoplasm of prostate: Secondary | ICD-10-CM | POA: Diagnosis not present

## 2021-01-20 DIAGNOSIS — Z510 Encounter for antineoplastic radiation therapy: Secondary | ICD-10-CM | POA: Diagnosis not present

## 2021-01-21 ENCOUNTER — Ambulatory Visit
Admission: RE | Admit: 2021-01-21 | Discharge: 2021-01-21 | Disposition: A | Discharge status: Home / Self Care | Payer: PPO | Source: Ambulatory Visit | Attending: Radiation Oncology | Admitting: Radiation Oncology

## 2021-01-21 ENCOUNTER — Ambulatory Visit: Payer: PPO

## 2021-01-21 ENCOUNTER — Other Ambulatory Visit: Payer: Self-pay

## 2021-01-21 ENCOUNTER — Encounter: Payer: Self-pay | Admitting: Urology

## 2021-01-21 DIAGNOSIS — Z510 Encounter for antineoplastic radiation therapy: Secondary | ICD-10-CM | POA: Diagnosis not present

## 2021-01-21 DIAGNOSIS — C61 Malignant neoplasm of prostate: Secondary | ICD-10-CM | POA: Diagnosis not present

## 2021-01-24 ENCOUNTER — Ambulatory Visit: Payer: PPO

## 2021-01-25 ENCOUNTER — Ambulatory Visit: Payer: PPO

## 2021-01-26 ENCOUNTER — Ambulatory Visit: Payer: PPO

## 2021-01-31 ENCOUNTER — Ambulatory Visit: Payer: PPO

## 2021-02-01 ENCOUNTER — Ambulatory Visit: Payer: PPO

## 2021-02-02 ENCOUNTER — Ambulatory Visit: Payer: PPO

## 2021-02-02 DIAGNOSIS — I10 Essential (primary) hypertension: Secondary | ICD-10-CM | POA: Diagnosis not present

## 2021-02-02 DIAGNOSIS — J449 Chronic obstructive pulmonary disease, unspecified: Secondary | ICD-10-CM | POA: Diagnosis not present

## 2021-02-02 DIAGNOSIS — J440 Chronic obstructive pulmonary disease with acute lower respiratory infection: Secondary | ICD-10-CM | POA: Diagnosis not present

## 2021-03-01 ENCOUNTER — Encounter: Payer: Self-pay | Admitting: Urology

## 2021-03-01 NOTE — Progress Notes (Signed)
Patient states doing well. No symptoms reported at this time. Meaningful use complete.  I-PSS score of 1 (mild).  No current urinary management medications taken at this time. Follow-up w/ [facility] set for January 18th, 2023 (lab work).  Patient notified of 11:00am-03/02/21 telephone appointment w/ Freeman Caldron, PA-C and verbalized understanding.  Patient preferred contact # [phone]

## 2021-03-02 ENCOUNTER — Ambulatory Visit
Admission: RE | Admit: 2021-03-02 | Discharge: 2021-03-02 | Disposition: A | Discharge status: Home / Self Care | Payer: PPO | Source: Ambulatory Visit | Attending: Urology | Admitting: Urology

## 2021-03-02 DIAGNOSIS — C61 Malignant neoplasm of prostate: Secondary | ICD-10-CM

## 2021-03-02 NOTE — Progress Notes (Signed)
°  Radiation Oncology         (336) [phone] ________________________________  Name: Alex Franklin MRN: [id number]  Date: 01/21/2021  DOB: [birth year]  End of Treatment Note  Diagnosis:   80 y.o. gentleman with locally recurrent prostate cancer with a rising PSA of 0.53 s/p RPP 11/2001 for Stage pT2c,Nx, Gleason 3+4 prostate cancer.     Indication for treatment:  Curative, Definitive Radiotherapy       Radiation treatment dates:   12/16/20 - 01/21/21  Site/dose: The prostate fossa was treated to 66 Gy in 33 fractions of 2 Gy each  Beams/energy:  The prostate fossa was treated using VMAT intensity modulated radiotherapy delivering 6 megavolt photons. Image guidance was performed with CB-CT studies prior to each fraction. He was immobilized with a body fix lower extremity mold.  Narrative: The patient tolerated radiation treatment relatively well with only minor urinary irritation and modest fatigue.  He reported mild occasional dysuria, increased frequency and nocturia x3.  He denied any abdominal pain or bowel issues.  Plan: The patient has completed radiation treatment. He will return to radiation oncology clinic for routine followup in one month. I advised him to call or return sooner if he has any questions or concerns related to his recovery or treatment. ________________________________  Sheral Apley. Tammi Klippel, M.D.

## 2021-03-02 NOTE — Progress Notes (Signed)
Radiation Oncology         [phone]) [phone] ________________________________  Name: ERIBERTO FELCH MRN: [id number]  Date: 03/02/2021  DOB: [birth year]  Post Treatment Note  CC: Wenda Low, MD  Davis Gourd*  Diagnosis:   80 y.o. gentleman with locally recurrent prostate cancer with a rising PSA of 0.53 s/p RPP 11/2001 for Stage pT2c,Nx, Gleason 3+4 prostate cancer.     Interval Since Last Radiation:  5.5 weeks  12/16/20 - 01/21/21:  The prostate fossa was treated to 66 Gy in 33 fractions of 2 Gy each  Narrative:  I spoke with the patient to conduct his routine scheduled 1 month follow up visit via telephone to spare the patient unnecessary potential exposure in the healthcare setting during the current COVID-19 pandemic.  The patient was notified in advance and gave permission to proceed with this visit format.  He tolerated radiation treatment relatively well with only minor urinary irritation and modest fatigue.  He reported mild occasional dysuria, increased frequency and nocturia x3.  He denied any abdominal pain or bowel issues.                              On review of systems, the patient states that he is doing very well in general and is currently without complaints.  He reports that his LUTS have completely resolved and he is back to his baseline at this point.  He specifically denies dysuria, gross hematuria, straining to void, incomplete bladder emptying or incontinence.  He reports a healthy appetite and is maintaining his weight.  He denies abdominal pain, nausea, vomiting, diarrhea or constipation.  He has not noticed any significant impact on his energy level and overall, is quite pleased with his progress to date.  ALLERGIES:  has No Known Allergies.  Meds: Current Outpatient Medications  Medication Sig Dispense Refill   amLODipine (NORVASC) 10 MG tablet Take 10 mg by mouth every morning.     aspirin EC 81 MG tablet Take 81 mg by mouth every morning.      benazepril (LOTENSIN) 40 MG tablet Take 40 mg by mouth every morning.     cholecalciferol (VITAMIN D) 1000 units tablet Take 1,000 Units by mouth daily.     losartan (COZAAR) 50 MG tablet Take 50 mg by mouth daily.     Omega-3 Fatty Acids (FISH OIL) 1200 MG CAPS Take 1,200 mg by mouth daily.     simvastatin (ZOCOR) 20 MG tablet Take 20 mg by mouth every morning.     SYMBICORT 80-4.5 MCG/ACT inhaler SMARTSIG:2 Puff(s) Via Inhaler Daily     No current facility-administered medications for this encounter.    Physical Findings:  vitals were not taken for this visit.  Pain Assessment Pain Score: 0-No pain/10 Unable to assess due to telephone follow-up visit format.  Lab Findings: Lab Results  Component Value Date   WBC 11.6 (H) 10/19/2008   HGB 16.7 10/19/2008   HCT 49.0 10/19/2008   MCV 99.4 10/19/2008   PLT 315 10/19/2008     Radiographic Findings: No results found.  Impression/Plan: 1. 80 y.o. gentleman with locally recurrent prostate cancer with a rising PSA of 0.53 s/p RPP 11/2001 for Stage pT2c,Nx, Gleason 3+4 prostate cancer.    He will continue to follow up with urology for ongoing PSA determinations and has an appointment scheduled for labs on March 23, 2021 and anticipates seeing Dr. Lovena Neighbours the following week. He understands  what to expect with regards to PSA monitoring going forward. I will look forward to following his response to treatment via correspondence with urology, and would be happy to continue to participate in his care if clinically indicated. I talked to the patient about what to expect in the future, including his risk for erectile dysfunction and rectal bleeding. I encouraged him to call or return to the office if he has any questions regarding his previous radiation or possible radiation side effects. He was comfortable with this plan and will follow up as needed.     Nicholos Johns, PA-C

## 2021-03-07 DIAGNOSIS — U071 COVID-19: Secondary | ICD-10-CM | POA: Diagnosis not present

## 2021-03-10 NOTE — Progress Notes (Signed)
RN called patient to follow up with questions regarding upcoming appointments with Dr. Lovena Neighbours.    RN notified that patient is scheduled for labs on 1/18 for PSA recheck.  Pt was inquiring if he needs to see Dr. Lovena Neighbours after. I informed patient vis his last visit with urology recommendations to follow up in 1 year.  I encouraged patient to contact [facility] to obtain MD follow up if he developed symptoms related to his LUTS.  Pt verbalized understanding and voiced he will wait until after lab appointment to contact clinic.

## 2021-03-23 DIAGNOSIS — C61 Malignant neoplasm of prostate: Secondary | ICD-10-CM | POA: Diagnosis not present

## 2021-03-31 DIAGNOSIS — R3129 Other microscopic hematuria: Secondary | ICD-10-CM | POA: Diagnosis not present

## 2021-03-31 DIAGNOSIS — C61 Malignant neoplasm of prostate: Secondary | ICD-10-CM | POA: Diagnosis not present

## 2021-04-01 ENCOUNTER — Telehealth: Payer: Self-pay | Admitting: *Deleted

## 2021-04-05 ENCOUNTER — Telehealth: Payer: Self-pay | Admitting: *Deleted

## 2021-04-14 ENCOUNTER — Telehealth: Payer: Self-pay | Admitting: *Deleted

## 2021-04-18 ENCOUNTER — Inpatient Hospital Stay: Payer: PPO | Admitting: *Deleted

## 2021-04-27 DIAGNOSIS — E7800 Pure hypercholesterolemia, unspecified: Secondary | ICD-10-CM | POA: Diagnosis not present

## 2021-04-27 DIAGNOSIS — I10 Essential (primary) hypertension: Secondary | ICD-10-CM | POA: Diagnosis not present

## 2021-04-27 DIAGNOSIS — J440 Chronic obstructive pulmonary disease with acute lower respiratory infection: Secondary | ICD-10-CM | POA: Diagnosis not present

## 2021-04-28 ENCOUNTER — Encounter: Payer: PPO | Admitting: *Deleted

## 2021-05-02 ENCOUNTER — Telehealth: Payer: Self-pay | Admitting: *Deleted

## 2021-05-03 ENCOUNTER — Other Ambulatory Visit: Payer: Self-pay

## 2021-05-03 ENCOUNTER — Inpatient Hospital Stay: Payer: PPO | Attending: Adult Health | Admitting: *Deleted

## 2021-05-03 ENCOUNTER — Encounter: Payer: Self-pay | Admitting: *Deleted

## 2021-05-03 DIAGNOSIS — I700 Atherosclerosis of aorta: Secondary | ICD-10-CM | POA: Diagnosis not present

## 2021-05-03 DIAGNOSIS — J440 Chronic obstructive pulmonary disease with acute lower respiratory infection: Secondary | ICD-10-CM | POA: Diagnosis not present

## 2021-05-03 DIAGNOSIS — I10 Essential (primary) hypertension: Secondary | ICD-10-CM | POA: Diagnosis not present

## 2021-05-03 DIAGNOSIS — C61 Malignant neoplasm of prostate: Secondary | ICD-10-CM | POA: Diagnosis not present

## 2021-05-03 DIAGNOSIS — E7800 Pure hypercholesterolemia, unspecified: Secondary | ICD-10-CM | POA: Diagnosis not present

## 2021-05-03 DIAGNOSIS — J849 Interstitial pulmonary disease, unspecified: Secondary | ICD-10-CM | POA: Diagnosis not present

## 2021-05-03 DIAGNOSIS — E871 Hypo-osmolality and hyponatremia: Secondary | ICD-10-CM | POA: Diagnosis not present

## 2021-05-03 NOTE — Progress Notes (Signed)
2 Identifiers used for verification purposes only. No vital signs taken at this appt as this was a telephone visit. SCP reviewed and completed. Pt saw PCP today.Last colonoscopy was 2017. He has aged out from colonoscopies He does see a dermatologist yearly. Pt does have chronic back pain that he rates a 3/10. He has had it for many years. Pt does urinate more frequently and feel like his urine flow is fair. He sleeps well only getting up to bathroom once at night.

## 2021-05-18 DIAGNOSIS — J440 Chronic obstructive pulmonary disease with acute lower respiratory infection: Secondary | ICD-10-CM | POA: Diagnosis not present

## 2021-05-18 DIAGNOSIS — E7800 Pure hypercholesterolemia, unspecified: Secondary | ICD-10-CM | POA: Diagnosis not present

## 2021-05-18 DIAGNOSIS — I10 Essential (primary) hypertension: Secondary | ICD-10-CM | POA: Diagnosis not present

## 2021-05-30 ENCOUNTER — Encounter: Payer: Self-pay | Admitting: Internal Medicine

## 2021-05-30 ENCOUNTER — Other Ambulatory Visit: Payer: Self-pay

## 2021-05-30 ENCOUNTER — Ambulatory Visit: Payer: PPO | Admitting: Internal Medicine

## 2021-05-30 VITALS — BP 130/69 | HR 61 | Ht 69.0 in | Wt 170.0 lb

## 2021-05-30 DIAGNOSIS — I700 Atherosclerosis of aorta: Secondary | ICD-10-CM | POA: Diagnosis not present

## 2021-05-30 DIAGNOSIS — I10 Essential (primary) hypertension: Secondary | ICD-10-CM | POA: Diagnosis not present

## 2021-05-30 DIAGNOSIS — I739 Peripheral vascular disease, unspecified: Secondary | ICD-10-CM | POA: Diagnosis not present

## 2021-05-30 NOTE — Progress Notes (Signed)
?Cardiology Office Note:   ? ?Date:  05/30/2021  ? ?ID:  Alex Franklin, DOB 1940-04-26, MRN [id number] ? ?PCP:  Wenda Low, MD ?  ?[facility] Providers ?Cardiologist:  Werner Lean, MD    ? ?Referring MD: Wenda Low, MD  ? ?CC: leg claudication  ?Consulted for the evaluation of PAD at the behest of Wenda Low, MD ? ?History of Present Illness:   ? ?Alex Franklin is a 81 y.o. 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 ? ?Patient notes that he is feeling fine except for after walking his dog or push mowing his yard without left leg pain.   ? ?Has had no chest pain, chest pressure, chest tightness, chest stinging. No shortness of breath, DOE.  No PND or orthopnea.  No weight gain, leg swelling , or abdominal swelling.  No syncope or near syncope . Notes  no palpitations or funny heart beats.    ? ?No rest pain in either leg.  No wounds in either leg. ? ?Previously saw Dr. Claiborne Billings. ? ? ?Past Medical History:  ?Diagnosis Date  ? Arthritis   ? GERD (gastroesophageal reflux disease)   ? Hypertension   ? Prostate cancer ([place]) 10/19/2020  ? ? ?Past Surgical History:  ?Procedure Laterality Date  ? COLONOSCOPY WITH PROPOFOL N/A 06/07/2015  ? Procedure: COLONOSCOPY WITH PROPOFOL;  Surgeon: Garlan Fair, MD;  Location: [facility];  Service: Endoscopy;  Laterality: N/A;  ? EYE SURGERY    ? bilateral cataract surgery with lens implants  ? EYE SURGERY    ? detached retina   ? prostectomy    ? ? ?Current Medications: ?Current Meds  ?Medication Sig  ? amLODipine (NORVASC) 10 MG tablet Take 10 mg by mouth every morning.  ? aspirin EC 81 MG tablet Take 81 mg by mouth every morning.  ? benazepril (LOTENSIN) 40 MG tablet Take 40 mg by mouth every morning.  ? cholecalciferol (VITAMIN D) 1000 units tablet Take 1,000 Units by mouth daily.  ? Cyanocobalamin (VITAMIN B12) 1000 MCG TBCR daily.  ?  doxazosin (CARDURA) 4 MG tablet Take 4 mg by mouth daily. Pt takes at the bedtime.  ? ibuprofen (ADVIL) 200 MG tablet Take 200 mg by mouth as needed.  ? losartan (COZAAR) 100 MG tablet Take 100 mg by mouth daily.  ? Omega-3 Fatty Acids (FISH OIL) 1200 MG CAPS Take 1,200 mg by mouth daily.  ? simvastatin (ZOCOR) 20 MG tablet Take 20 mg by mouth every morning.  ? SYMBICORT 80-4.5 MCG/ACT inhaler 2 puffs as needed.  ?  ? ?Allergies:   Patient has no known allergies.  ? ?Social History  ? ?Socioeconomic History  ? Marital status: Married  ?  Spouse name: Not on file  ? Number of children: Not on file  ? Years of education: Not on file  ? Highest education level: Not on file  ?Occupational History  ? Not on file  ?Tobacco Use  ? Smoking status: Former  ?  Types: Cigarettes  ?  Quit date: 07/13/2013  ?  Years since quitting: 7.8  ? Smokeless tobacco: Not on file  ?Substance and Sexual Activity  ? Alcohol use: Yes  ? Drug use: No  ? Sexual activity: Not on file  ?Other Topics Concern  ? Not on file  ?Social History Narrative  ? Not on file  ? ?Social Determinants  of Health  ? ?Financial Resource Strain: Not on file  ?Food Insecurity: Not on file  ?Transportation Needs: Not on file  ?Physical Activity: Not on file  ?Stress: Not on file  ?Social Connections: Not on file  ?  ?Social: From [place]; wife has PAD and has had great experiences with Dr. Gwenlyn Found ? ?Family History: ?No family history of PAD ? ?ROS:   ?Please see the history of present illness.    ?All other systems reviewed and are negative. ? ?EKGs/Labs/Other Studies Reviewed:   ? ?The following studies were reviewed today: ? ?EKG:  EKG is  ordered today.  The ekg ordered today demonstrates  ?05/30/21: SR rate 69 ?Recent Labs: ?No results found for requested labs within last 8760 hours.  ?Recent Lipid Panel ?No results found for: CHOL, TRIG, HDL, CHOLHDL, VLDL, LDLCALC, LDLDIRECT ?    ? ?Physical Exam:   ? ?VS:  BP 130/69   Pulse 61   Ht '5\' 9"'$  (1.753 m)   Wt 170  lb (77.1 kg)   SpO2 96%   BMI 25.10 kg/m?    ? ?Wt Readings from Last 3 Encounters:  ?05/30/21 170 lb (77.1 kg)  ?06/07/15 170 lb (77.1 kg)  ?  ? ?Gen: no distress   ?Neck: No JVD ?Ears: bilateral Pilar Plate Sign ?Cardiac: No Rubs or Gallops, no Murmur, RRR +2 radial pulses +2 R PD pulse diminished left PD pulse ?Respiratory: Clear to auscultation bilaterally, normal effort, normal  respiratory rate ?GI: Soft, nontender, non-distended  ?MS: non pitting bilateral  edema;  moves all extremities ?Integument: Skin feels warm, no LE wounds ?Neuro:  At time of evaluation, alert and oriented to person/place/time/situation  ?Psych: Normal affect, patient feels well ? ? ?ASSESSMENT:   ? ?1. Left leg claudication ([place])   ?2. Essential hypertension   ?3. Aortic atherosclerosis ([place])   ? ?PLAN:   ? ?Left  leg claudication ?Aortic atherosclerosis ?Prostate Cancer with 30+ Gy in the pelvic field ?HTN ?- LDL at goal ?- ASA 81 mg / Plavix 75 mg ?- Continue statin, LDL 71 ?- No Tobacco Use  ?- If mild PAD will trial cilostazol 100 mg BID  ?- if significance will refer to Dr. Gwenlyn Found ?- Flu shot  in fall is recommended ?- continue current medication ? ?One year f/u unless new sx ? ?   ? ? ?Medication Adjustments/Labs and Tests Ordered: ?Current medicines are reviewed at length with the patient today.  Concerns regarding medicines are outlined above.  ?Orders Placed This Encounter  ?Procedures  ? EKG 12-Lead  ? VAS [place] LOWER EXTREMITY ARTERIAL DUPLEX  ? VAS [place] ABI WITH/WO TBI  ? ?No orders of the defined types were placed in this encounter. ? ? ?Patient Instructions  ?Medication Instructions:  ?Your physician recommends that you continue on your current medications as directed. Please refer to the Current Medication list given to you today. ? ?*If you need a refill on your cardiac medications before your next appointment, please call your pharmacy* ? ? ?Lab Work: ?NONE ?If you have labs (blood work) drawn today and your tests are completely  normal, you will receive your results only by: ?MyChart Message (if you have MyChart) OR ?A paper copy in the mail ?If you have any lab test that is abnormal or we need to change your treatment, we will call you to review the results. ? ? ?Testing/Procedures: ?Your physician has requested that you have an ankle brachial index (ABI). During this test an ultrasound and  blood pressure cuff are used to evaluate the arteries that supply the arms and legs with blood. Allow thirty minutes for this exam. There are no restrictions or special instructions. ?Your physician has requested that you have a left lower extremity  arterial duplex. This test is an ultrasound of the arteries in the leg. It looks at arterial blood flow in the leg.  There are no restrictions or special instructions  ? ? ?Follow-Up: ?At [facility], you and your health needs are our priority.  As part of our continuing mission to provide you with exceptional heart care, we have created designated Provider Care Teams.  These Care Teams include your primary Cardiologist (physician) and Advanced Practice Providers (APPs -  Physician Assistants and Nurse Practitioners) who all work together to provide you with the care you need, when you need it. ? ? ?Your next appointment:   ?1 year(s) ? ?The format for your next appointment:   ?In Person ? ?Provider:   ?Werner Lean, MD   ?  ? ?Signed, ?Werner Lean, MD  ?05/30/2021 3:42 PM    ?[place] ? ?

## 2021-05-30 NOTE — Patient Instructions (Signed)
Medication Instructions:  ?Your physician recommends that you continue on your current medications as directed. Please refer to the Current Medication list given to you today. ? ?*If you need a refill on your cardiac medications before your next appointment, please call your pharmacy* ? ? ?Lab Work: ?NONE ?If you have labs (blood work) drawn today and your tests are completely normal, you will receive your results only by: ?MyChart Message (if you have MyChart) OR ?A paper copy in the mail ?If you have any lab test that is abnormal or we need to change your treatment, we will call you to review the results. ? ? ?Testing/Procedures: ?Your physician has requested that you have an ankle brachial index (ABI). During this test an ultrasound and blood pressure cuff are used to evaluate the arteries that supply the arms and legs with blood. Allow thirty minutes for this exam. There are no restrictions or special instructions. ?Your physician has requested that you have a left lower extremity  arterial duplex. This test is an ultrasound of the arteries in the leg. It looks at arterial blood flow in the leg.  There are no restrictions or special instructions  ? ? ?Follow-Up: ?At [facility], you and your health needs are our priority.  As part of our continuing mission to provide you with exceptional heart care, we have created designated Provider Care Teams.  These Care Teams include your primary Cardiologist (physician) and Advanced Practice Providers (APPs -  Physician Assistants and Nurse Practitioners) who all work together to provide you with the care you need, when you need it. ? ? ?Your next appointment:   ?1 year(s) ? ?The format for your next appointment:   ?In Person ? ?Provider:   ?Werner Lean, MD   ? ?

## 2021-06-06 ENCOUNTER — Ambulatory Visit (HOSPITAL_COMMUNITY)
Admission: RE | Admit: 2021-06-06 | Discharge: 2021-06-06 | Disposition: A | Discharge status: Home / Self Care | Payer: PPO | Source: Ambulatory Visit | Attending: Internal Medicine | Admitting: Internal Medicine

## 2021-06-06 DIAGNOSIS — I739 Peripheral vascular disease, unspecified: Secondary | ICD-10-CM | POA: Diagnosis not present

## 2021-06-07 ENCOUNTER — Telehealth: Payer: Self-pay | Admitting: Internal Medicine

## 2021-06-07 DIAGNOSIS — I739 Peripheral vascular disease, unspecified: Secondary | ICD-10-CM

## 2021-06-07 NOTE — Telephone Encounter (Signed)
-----   Message from Werner Lean, MD sent at 06/07/2021  1:17 PM EDT ----- ?Results: ?Blockage in leg ?Plan: ?Referral Dr. Gwenlyn Found ? ?Werner Lean, MD ? ?

## 2021-06-07 NOTE — Telephone Encounter (Signed)
The patient has been notified of the result and verbalized understanding.  All questions (if any) were answered. ?Precious Gilding, RN 06/07/2021 5:03 PM   ?Referral to Dr. Gwenlyn Found placed.  ?

## 2021-06-07 NOTE — Telephone Encounter (Signed)
Pt returning phone call. He states that someone called around 1 pm. He thinks that it may be regarding his test results. Please advise ?

## 2021-06-15 DIAGNOSIS — M47816 Spondylosis without myelopathy or radiculopathy, lumbar region: Secondary | ICD-10-CM | POA: Diagnosis not present

## 2021-06-24 ENCOUNTER — Encounter: Payer: Self-pay | Admitting: Cardiovascular Disease

## 2021-06-24 ENCOUNTER — Ambulatory Visit: Payer: PPO | Admitting: Cardiovascular Disease

## 2021-06-24 ENCOUNTER — Other Ambulatory Visit: Payer: Self-pay

## 2021-06-24 DIAGNOSIS — I739 Peripheral vascular disease, unspecified: Secondary | ICD-10-CM | POA: Diagnosis not present

## 2021-06-24 MED ORDER — SODIUM CHLORIDE 0.9% FLUSH: 3.0000 mL | Freq: Two times a day (BID) | INTRAVENOUS | Status: AC

## 2021-06-24 NOTE — Assessment & Plan Note (Signed)
Alex Franklin was referred to me by Dr. Gasper Sells for PAD.  He is fairly active walking, walking his dog and playing golf.  He has left calf claudication.  He had Doppler studies performed in our office 06/06/2021 revealing a right ABI of 0.93 and a left of 0.61.  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  He wishes to proceed with angiography and potential endovascular therapy for lifestyle-limiting claudication. ? ?

## 2021-06-24 NOTE — Progress Notes (Signed)
? ? ? ?06/24/2021 ?Alex Franklin   ?Jan 11, 1941  ?[id number] ? ?Primary Physician Wenda Low, MD ?Primary Cardiologist: Lorretta Harp MD Alex Franklin ? ?HPI:  Alex Franklin is a 81 y.o. 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  His wife Alex Franklin is a patient of mine as well.  He was referred to me for PAD by his primary cardiologist Dr.Chandrasekhar for lifestyle-limiting claudication.  His risk factors include discontinue tobacco abuse having stopped 7 years ago and smoked 30 to 50 pack years.  History of hypertension and hyperlipidemia.  His mother had CABG at brother has CAD as well.  He is never had a heart attack or stroke.  He is fairly active and walks and plays golf.  Does complain of left lower extremity claudication.  He had Doppler studies performed in our office 06/06/2021 revealing a right ABI of 0.93 and a left of 0.61 with a moderate lesion in his mid right SFA and a high-grade lesion in his distal left common femoral artery. ? ? ?Current Meds  ?Medication Sig  ? amLODipine (NORVASC) 10 MG tablet Take 10 mg by mouth every morning.  ? aspirin EC 81 MG tablet Take 81 mg by mouth every morning.  ? benazepril (LOTENSIN) 40 MG tablet Take 40 mg by mouth every morning.  ? cholecalciferol (VITAMIN D) 1000 units tablet Take 1,000 Units by mouth daily.  ? Cyanocobalamin (VITAMIN B12) 1000 MCG TBCR daily.  ? doxazosin (CARDURA) 4 MG tablet Take 4 mg by mouth daily. Pt takes at the bedtime.  ? ibuprofen (ADVIL) 200 MG tablet Take 200 mg by mouth as needed.  ? losartan (COZAAR) 100 MG tablet Take 100 mg by mouth daily.  ? Omega-3 Fatty Acids (FISH OIL) 1200 MG CAPS Take 1,200 mg by mouth daily.  ? simvastatin (ZOCOR) 20 MG tablet Take 20 mg by mouth every morning.  ? SYMBICORT 80-4.5 MCG/ACT inhaler 2 puffs as needed.  ?  ? ?No Known Allergies ? ?Social History  ? ?Socioeconomic History  ? Marital status:  Married  ?  Spouse name: Not on file  ? Number of children: Not on file  ? Years of education: Not on file  ? Highest education level: Not on file  ?Occupational History  ? Not on file  ?Tobacco Use  ? Smoking status: Former  ?  Types: Cigarettes  ?  Quit date: 07/13/2013  ?  Years since quitting: 7.9  ? Smokeless tobacco: Not on file  ?Substance and Sexual Activity  ? Alcohol use: Yes  ? Drug use: No  ? Sexual activity: Not on file  ?Other Topics Concern  ? Not on file  ?Social History Narrative  ? Not on file  ? ?Social Determinants of Health  ? ?Financial Resource Strain: Not on file  ?Food Insecurity: Not on file  ?Transportation Needs: Not on file  ?Physical Activity: Not on file  ?Stress: Not on file  ?Social Connections: Not on file  ?Intimate Partner Violence: Not on file  ?  ? ?Review of Systems: ?General: negative for chills, fever, night sweats or weight changes.  ?Cardiovascular: negative for chest pain, dyspnea on exertion, edema, orthopnea, palpitations, paroxysmal nocturnal dyspnea or shortness of breath ?Dermatological: negative for rash ?Respiratory: negative for cough or wheezing ?Urologic: negative for hematuria ?Abdominal: negative for nausea, vomiting, diarrhea, bright red blood per rectum, melena, or hematemesis ?Neurologic: negative for visual changes,  syncope, or dizziness ?All other systems reviewed and are otherwise negative except as noted above. ? ? ? ?Blood pressure 108/60, pulse 61, height '5\' 9"'$  (1.753 m), weight 169 lb 12.8 oz (77 kg), SpO2 93 %.  ?General appearance: alert and no distress ?Neck: no adenopathy, no carotid bruit, no JVD, supple, symmetrical, trachea midline, and thyroid not enlarged, symmetric, no tenderness/mass/nodules ?Lungs: clear to auscultation bilaterally ?Heart: regular rate and rhythm, S1, S2 normal, no murmur, click, rub or gallop ?Extremities: extremities normal, atraumatic, no cyanosis or edema ?Pulses: Decreased left pedal pulse ?Skin: Skin color, texture,  turgor normal. No rashes or lesions ?Neurologic: Grossly normal ? ?EKG sinus rhythm at 61 without ST or T wave changes.  I personally reviewed this EKG. ? ?ASSESSMENT AND PLAN:  ? ?Left leg claudication ([place]) ?Mr. Hintze was referred to me by Dr. Gasper Sells for PAD.  He is fairly active walking, walking his dog and playing golf.  He has left calf claudication.  He had Doppler studies performed in our office 06/06/2021 revealing a right ABI of 0.93 and a left of 0.61.  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  He wishes to proceed with angiography and potential endovascular therapy for lifestyle-limiting claudication. ? ? ? ? ? ?Lorretta Harp MD FACP,FACC,FAHA, FSCAI ?06/24/2021 ?2:36 PM ?

## 2021-06-24 NOTE — H&P (View-Only) (Signed)
? ? ? ?06/24/2021 ?Alex Franklin   ?20-Jun-1940  ?[id number] ? ?Primary Physician Wenda Low, MD ?Primary Cardiologist: Lorretta Harp MD [name], [place] ? ?HPI:  Alex Franklin is a 81 y.o. 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  His wife Alex Franklin is a patient of mine as well.  He was referred to me for PAD by his primary cardiologist Dr.Chandrasekhar for lifestyle-limiting claudication.  His risk factors include discontinue tobacco abuse having stopped 7 years ago and smoked 30 to 50 pack years.  History of hypertension and hyperlipidemia.  His mother had CABG at brother has CAD as well.  He is never had a heart attack or stroke.  He is fairly active and walks and plays golf.  Does complain of left lower extremity claudication.  He had Doppler studies performed in our office 06/06/2021 revealing a right ABI of 0.93 and a left of 0.61 with a moderate lesion in his mid right SFA and a high-grade lesion in his distal left common femoral artery. ? ? ?Current Meds  ?Medication Sig  ? amLODipine (NORVASC) 10 MG tablet Take 10 mg by mouth every morning.  ? aspirin EC 81 MG tablet Take 81 mg by mouth every morning.  ? benazepril (LOTENSIN) 40 MG tablet Take 40 mg by mouth every morning.  ? cholecalciferol (VITAMIN D) 1000 units tablet Take 1,000 Units by mouth daily.  ? Cyanocobalamin (VITAMIN B12) 1000 MCG TBCR daily.  ? doxazosin (CARDURA) 4 MG tablet Take 4 mg by mouth daily. Pt takes at the bedtime.  ? ibuprofen (ADVIL) 200 MG tablet Take 200 mg by mouth as needed.  ? losartan (COZAAR) 100 MG tablet Take 100 mg by mouth daily.  ? Omega-3 Fatty Acids (FISH OIL) 1200 MG CAPS Take 1,200 mg by mouth daily.  ? simvastatin (ZOCOR) 20 MG tablet Take 20 mg by mouth every morning.  ? SYMBICORT 80-4.5 MCG/ACT inhaler 2 puffs as needed.  ?  ? ?No Known Allergies ? ?Social History  ? ?Socioeconomic History  ? Marital status:  Married  ?  Spouse name: Not on file  ? Number of children: Not on file  ? Years of education: Not on file  ? Highest education level: Not on file  ?Occupational History  ? Not on file  ?Tobacco Use  ? Smoking status: Former  ?  Types: Cigarettes  ?  Quit date: 07/13/2013  ?  Years since quitting: 7.9  ? Smokeless tobacco: Not on file  ?Substance and Sexual Activity  ? Alcohol use: Yes  ? Drug use: No  ? Sexual activity: Not on file  ?Other Topics Concern  ? Not on file  ?Social History Narrative  ? Not on file  ? ?Social Determinants of Health  ? ?Financial Resource Strain: Not on file  ?Food Insecurity: Not on file  ?Transportation Needs: Not on file  ?Physical Activity: Not on file  ?Stress: Not on file  ?Social Connections: Not on file  ?Intimate Partner Violence: Not on file  ?  ? ?Review of Systems: ?General: negative for chills, fever, night sweats or weight changes.  ?Cardiovascular: negative for chest pain, dyspnea on exertion, edema, orthopnea, palpitations, paroxysmal nocturnal dyspnea or shortness of breath ?Dermatological: negative for rash ?Respiratory: negative for cough or wheezing ?Urologic: negative for hematuria ?Abdominal: negative for nausea, vomiting, diarrhea, bright red blood per rectum, melena, or hematemesis ?Neurologic: negative for visual changes,  syncope, or dizziness ?All other systems reviewed and are otherwise negative except as noted above. ? ? ? ?Blood pressure 108/60, pulse 61, height '5\' 9"'$  (1.753 m), weight 169 lb 12.8 oz (77 kg), SpO2 93 %.  ?General appearance: alert and no distress ?Neck: no adenopathy, no carotid bruit, no JVD, supple, symmetrical, trachea midline, and thyroid not enlarged, symmetric, no tenderness/mass/nodules ?Lungs: clear to auscultation bilaterally ?Heart: regular rate and rhythm, S1, S2 normal, no murmur, click, rub or gallop ?Extremities: extremities normal, atraumatic, no cyanosis or edema ?Pulses: Decreased left pedal pulse ?Skin: Skin color, texture,  turgor normal. No rashes or lesions ?Neurologic: Grossly normal ? ?EKG sinus rhythm at 61 without ST or T wave changes.  I personally reviewed this EKG. ? ?ASSESSMENT AND PLAN:  ? ?Left leg claudication ([place]) ?Mr. Kunz was referred to me by Dr. Gasper Sells for PAD.  He is fairly active walking, walking his dog and playing golf.  He has left calf claudication.  He had Doppler studies performed in our office 06/06/2021 revealing a right ABI of 0.93 and a left of 0.61.  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  He wishes to proceed with angiography and potential endovascular therapy for lifestyle-limiting claudication. ? ? ? ? ? ?Lorretta Harp MD FACP,FACC,FAHA, FSCAI ?06/24/2021 ?2:36 PM ?

## 2021-06-24 NOTE — Patient Instructions (Signed)
Medication Instructions:  ?Your physician recommends that you continue on your current medications as directed. Please refer to the Current Medication list given to you today. ? ?*If you need a refill on your cardiac medications before your next appointment, please call your pharmacy* ? ? ?Lab Work: ?Your physician recommends that you have labs drawn today: BMET and CBC ? ?If you have labs (blood work) drawn today and your tests are completely normal, you will receive your results only by: ?MyChart Message (if you have MyChart) OR ?A paper copy in the mail ?If you have any lab test that is abnormal or we need to change your treatment, we will call you to review the results. ? ? ?Testing/Procedures: ?Your physician has requested that you have a lower extremity arterial duplex. This test is an ultrasound of the arteries in the legs. It looks at arterial blood flow in the legs. Allow one hour for Lower Arterial scans. There are no restrictions or special instructions ? ?Your physician has requested that you have an ankle brachial index (ABI). During this test an ultrasound and blood pressure cuff are used to evaluate the arteries that supply the arms and legs with blood. Allow thirty minutes for this exam. There are no restrictions or special instructions. To be done 1-2 weeks after your PV procedure (4/24). These will be done at [place]. [address] ? ? ?Follow-Up: ?At [facility], you and your health needs are our priority.  As part of our continuing mission to provide you with exceptional heart care, we have created designated Provider Care Teams.  These Care Teams include your primary Cardiologist (physician) and Advanced Practice Providers (APPs -  Physician Assistants and Nurse Practitioners) who all work together to provide you with the care you need, when you need it. ? ?We recommend signing up for the patient portal called "MyChart".  Sign up information is provided on this After Visit Summary.   MyChart is used to connect with patients for Virtual Visits (Telemedicine).  Patients are able to view lab/test results, encounter notes, upcoming appointments, etc.  Non-urgent messages can be sent to your provider as well.   ?To learn more about what you can do with MyChart, go to [email or web address].   ? ?Your next appointment:   ?2-3 week(s) after your procedure ? ?The format for your next appointment:   ?In Person ? ?Provider:   ?[name], MD ? ? ?Other Instructions ? ?[facility] ?[address] ?[place] [address] ?Dept: [phone] ?Loc: [phone] ? ?SCHON ZEIDERS  06/24/2021 ? ?You are scheduled for a Peripheral Angiogram on Monday, April 24 with Dr. [name]. ? ?1. Please arrive at the Main Entrance A at [facility] [place]: [place], [place] [address] at 7:30 AM (This time is two hours before your procedure to ensure your preparation). Free valet parking service is available.  ? ?Special note: Every effort is made to have your procedure done on time. Please understand that emergencies sometimes delay scheduled procedures. ? ?2. Diet: Do not eat solid foods after midnight.  You may have clear liquids until 5 AM upon the day of the procedure. ? ?3. Labs: You will need to have blood drawn today. ? ?4. Medication instructions in preparation for your procedure: ? ?  ?On the morning of your procedure, take Aspirin and any morning medicines NOT listed above.  You may use sips of water. ? ?5. Plan  to go home the same day, you will only stay overnight if medically necessary. ?6. You MUST have a responsible adult to drive you home. ?7. An adult MUST be with you the first 24 hours after you arrive home. ?8. Bring a current list of your medications, and the last time and date medication taken. ?9. Bring ID and current insurance cards. ?10.Please wear clothes that are easy to get on and off and  wear slip-on shoes. ? ?Thank you for allowing [place] to care for you! ?  -- [facility] ? ?

## 2021-06-25 LAB — BASIC METABOLIC PANEL
BUN/Creatinine Ratio: 14 (ref 10–24)
BUN: 19 mg/dL (ref 8–27)
CO2: 23 mmol/L (ref 20–29)
Calcium: 9.4 mg/dL (ref 8.6–10.2)
Chloride: 101 mmol/L (ref 96–106)
Creatinine, Ser: 1.39 mg/dL — ABNORMAL HIGH (ref 0.76–1.27)
Glucose: 84 mg/dL (ref 70–99)
Potassium: 5.4 mmol/L — ABNORMAL HIGH (ref 3.5–5.2)
Sodium: 138 mmol/L (ref 134–144)
eGFR: 51 mL/min/{1.73_m2} — ABNORMAL LOW (ref >59)

## 2021-06-25 LAB — CBC
Hematocrit: 40.3 % (ref 37.5–51.0)
Hemoglobin: 13.5 g/dL (ref 13.0–17.7)
MCH: 31.4 pg (ref 26.6–33.0)
MCHC: 33.5 g/dL (ref 31.5–35.7)
MCV: 94 fL (ref 79–97)
Platelets: 213 10*3/uL (ref 150–450)
RBC: 4.3 x10E6/uL (ref 4.14–5.80)
RDW: 12.7 % (ref 11.6–15.4)
WBC: 6 10*3/uL (ref 3.4–10.8)

## 2021-06-27 ENCOUNTER — Other Ambulatory Visit: Payer: Self-pay

## 2021-06-27 ENCOUNTER — Encounter (HOSPITAL_COMMUNITY): Payer: Self-pay | Admitting: Cardiovascular Disease

## 2021-06-27 ENCOUNTER — Ambulatory Visit (HOSPITAL_COMMUNITY)
Admission: RE | Admit: 2021-06-27 | Discharge: 2021-06-27 | Disposition: A | Discharge status: Home / Self Care | Payer: PPO | Attending: Cardiovascular Disease | Admitting: Cardiovascular Disease

## 2021-06-27 ENCOUNTER — Encounter (HOSPITAL_COMMUNITY)
Admission: RE | Disposition: A | Discharge status: Home / Self Care | Payer: Self-pay | Source: Home / Self Care | Attending: Cardiovascular Disease

## 2021-06-27 DIAGNOSIS — I739 Peripheral vascular disease, unspecified: Secondary | ICD-10-CM | POA: Diagnosis present

## 2021-06-27 DIAGNOSIS — I70212 Atherosclerosis of native arteries of extremities with intermittent claudication, left leg: Secondary | ICD-10-CM | POA: Diagnosis not present

## 2021-06-27 DIAGNOSIS — Z87891 Personal history of nicotine dependence: Secondary | ICD-10-CM | POA: Diagnosis not present

## 2021-06-27 DIAGNOSIS — I10 Essential (primary) hypertension: Secondary | ICD-10-CM | POA: Diagnosis not present

## 2021-06-27 DIAGNOSIS — E785 Hyperlipidemia, unspecified: Secondary | ICD-10-CM | POA: Diagnosis not present

## 2021-06-27 HISTORY — PX: ABDOMINAL AORTOGRAM W/LOWER EXTREMITY: CATH118223

## 2021-06-27 SURGERY — ABDOMINAL AORTOGRAM W/LOWER EXTREMITY
Anesthesia: LOCAL

## 2021-06-27 MED ORDER — ONDANSETRON HCL 4 MG/2ML IJ SOLN: 4.0000 mg | Freq: Four times a day (QID) | INTRAMUSCULAR | Status: DC | PRN

## 2021-06-27 MED ORDER — IODIXANOL 320 MG/ML IV SOLN
INTRAVENOUS | Status: DC | PRN
  Administered 2021-06-27: 122 mL

## 2021-06-27 MED ORDER — ASPIRIN 81 MG PO CHEW: 81.0000 mg | CHEWABLE_TABLET | ORAL | Status: DC

## 2021-06-27 MED ORDER — ACETAMINOPHEN 325 MG PO TABS: 650.0000 mg | ORAL_TABLET | ORAL | Status: DC | PRN

## 2021-06-27 MED ORDER — HYDRALAZINE HCL 20 MG/ML IJ SOLN: 5.0000 mg | INTRAMUSCULAR | Status: DC | PRN

## 2021-06-27 MED ORDER — MORPHINE SULFATE (PF) 2 MG/ML IV SOLN: 2.0000 mg | INTRAVENOUS | Status: DC | PRN

## 2021-06-27 MED ORDER — SODIUM CHLORIDE 0.9 % IV SOLN: 250.0000 mL | INTRAVENOUS | Status: DC | PRN

## 2021-06-27 MED ORDER — HEPARIN (PORCINE) IN NACL 1000-0.9 UT/500ML-% IV SOLN
INTRAVENOUS | Status: AC
  Filled 2021-06-27: qty 500

## 2021-06-27 MED ORDER — LABETALOL HCL 5 MG/ML IV SOLN: 10.0000 mg | INTRAVENOUS | Status: DC | PRN

## 2021-06-27 MED ORDER — SODIUM CHLORIDE 0.9 % WEIGHT BASED INFUSION
3.0000 mL/kg/h | INTRAVENOUS | Status: AC
  Administered 2021-06-27: 3 mL/kg/h via INTRAVENOUS

## 2021-06-27 MED ORDER — SODIUM CHLORIDE 0.9 % IV SOLN: INTRAVENOUS | Status: DC

## 2021-06-27 MED ORDER — HEPARIN (PORCINE) IN NACL 1000-0.9 UT/500ML-% IV SOLN
INTRAVENOUS | Status: DC | PRN
Start: 2021-06-27 — End: 2021-06-27
  Administered 2021-06-27 (×2): 500 mL

## 2021-06-27 MED ORDER — LIDOCAINE HCL (PF) 1 % IJ SOLN
INTRAMUSCULAR | Status: DC | PRN
  Administered 2021-06-27: 30 mL

## 2021-06-27 MED ORDER — SODIUM CHLORIDE 0.9% FLUSH: 3.0000 mL | INTRAVENOUS | Status: DC | PRN

## 2021-06-27 MED ORDER — LIDOCAINE HCL (PF) 1 % IJ SOLN
INTRAMUSCULAR | Status: AC
Start: 2021-06-27 — End: ?
  Filled 2021-06-27: qty 30

## 2021-06-27 MED ORDER — SODIUM CHLORIDE 0.9% FLUSH: 3.0000 mL | Freq: Two times a day (BID) | INTRAVENOUS | Status: DC

## 2021-06-27 MED ORDER — SODIUM CHLORIDE 0.9 % WEIGHT BASED INFUSION: 1.0000 mL/kg/h | INTRAVENOUS | Status: DC

## 2021-06-27 SURGICAL SUPPLY — 11 items
CATH ANGIO 5F PIGTAIL 65CM (CATHETERS) ×1 IMPLANT
CATH CROSS OVER TEMPO 5F (CATHETERS) ×1 IMPLANT
CATH STRAIGHT 5FR 65CM (CATHETERS) ×1 IMPLANT
CLOSURE MYNX CONTROL 5F (Vascular Products) ×1 IMPLANT
KIT PV (KITS) ×3 IMPLANT
SHEATH PINNACLE 5F 10CM (SHEATH) ×1 IMPLANT
SHEATH PROBE COVER 6X72 (BAG) ×1 IMPLANT
SYR MEDRAD MARK 7 150ML (SYRINGE) ×3 IMPLANT
TRANSDUCER W/STOPCOCK (MISCELLANEOUS) ×3 IMPLANT
TRAY PV CATH (CUSTOM PROCEDURE TRAY) ×3 IMPLANT
WIRE HITORQ VERSACORE ST 145CM (WIRE) ×1 IMPLANT

## 2021-06-27 NOTE — Interval H&P Note (Signed)
History and Physical Interval Note: ? ?06/27/2021 ?8:41 AM ? ?Alex Franklin  has presented today for surgery, with the diagnosis of PAD.  The various methods of treatment have been discussed with the patient and family. After consideration of risks, benefits and other options for treatment, the patient has consented to  Procedure(s): ?ABDOMINAL AORTOGRAM W/LOWER EXTREMITY (N/A) as a surgical intervention.  The patient's history has been reviewed, patient examined, no change in status, stable for surgery.  I have reviewed the patient's chart and labs.  Questions were answered to the patient's satisfaction.   ? ? ?[name] ? ? ?

## 2021-06-29 DIAGNOSIS — I739 Peripheral vascular disease, unspecified: Secondary | ICD-10-CM | POA: Diagnosis not present

## 2021-06-29 DIAGNOSIS — J440 Chronic obstructive pulmonary disease with acute lower respiratory infection: Secondary | ICD-10-CM | POA: Diagnosis not present

## 2021-06-29 DIAGNOSIS — I10 Essential (primary) hypertension: Secondary | ICD-10-CM | POA: Diagnosis not present

## 2021-07-08 ENCOUNTER — Telehealth: Payer: Self-pay

## 2021-07-08 ENCOUNTER — Encounter: Payer: Self-pay | Admitting: Cardiovascular Disease

## 2021-07-08 ENCOUNTER — Ambulatory Visit (INDEPENDENT_AMBULATORY_CARE_PROVIDER_SITE_OTHER): Payer: PPO | Admitting: Cardiovascular Disease

## 2021-07-08 VITALS — BP 115/60 | HR 61 | Ht 69.0 in | Wt 171.4 lb

## 2021-07-08 DIAGNOSIS — I739 Peripheral vascular disease, unspecified: Secondary | ICD-10-CM | POA: Diagnosis not present

## 2021-07-08 NOTE — Assessment & Plan Note (Signed)
Alex Franklin was referred to me by Dr. Jacqlyn Larsen for evaluation of PAD.  He did have left lower extremity claudication.  He is fairly active.  Dopplers performed in our office 06/06/2021 revealed a right ABI of 0.93 and a left of 0.61.  I performed peripheral angiography on him/20/21 revealing a 75% focal mid right SFA stenosis with three-vessel runoff and an occluded distal left common femoral artery extending into the proximal SFA and profunda as well as a small mid left SFA CTO with two-vessel runoff.  I believe he would benefit from femoral endarterectomy with patch angioplasty and profundoplasty after which we can decide whether or not he needs a mid left SFA CTO intervention.  I am going to refer him to Dr. Fortunato Curling for surgical consultation. ?

## 2021-07-08 NOTE — Telephone Encounter (Signed)
Pt would like to switch from Dr. Gasper Sells to Dr. Gwenlyn Found.  ?

## 2021-07-08 NOTE — Patient Instructions (Signed)
Medication Instructions:  ?Your physician recommends that you continue on your current medications as directed. Please refer to the Current Medication list given to you today. ? ?*If you need a refill on your cardiac medications before your next appointment, please call your pharmacy* ? ? ?Follow-Up: ?At [facility], you and your health needs are our priority.  As part of our continuing mission to provide you with exceptional heart care, we have created designated Provider Care Teams.  These Care Teams include your primary Cardiologist (physician) and Advanced Practice Providers (APPs -  Physician Assistants and Nurse Practitioners) who all work together to provide you with the care you need, when you need it. ? ?We recommend signing up for the patient portal called "MyChart".  Sign up information is provided on this After Visit Summary.  MyChart is used to connect with patients for Virtual Visits (Telemedicine).  Patients are able to view lab/test results, encounter notes, upcoming appointments, etc.  Non-urgent messages can be sent to your provider as well.   ?To learn more about what you can do with MyChart, go to [email or web address].   ? ?Your next appointment:   ?2 month(s) ? ?The format for your next appointment:   ?In Person ? ?Provider:   ?[name], MD ? ?

## 2021-07-08 NOTE — Progress Notes (Signed)
? ? ? ?07/08/2021 ?Alex Franklin   ?1940/05/10  ?[id number] ? ?Primary Physician Wenda Low, MD ?Primary Cardiologist: Lorretta Harp MD [name], [place] ? ?HPI:  Alex Franklin is a 81 y.o.  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  His wife Alex Franklin is a patient of mine as well.  He was referred to me for PAD by his primary cardiologist Dr.Chandrasekhar for lifestyle-limiting claudication.  I last saw him in the office 06/24/2021.  His risk factors include discontinue tobacco abuse having stopped 7 years ago and smoked 30 to 50 pack years.  History of hypertension and hyperlipidemia.  His mother had CABG at brother has CAD as well.  He is never had a heart attack or stroke.  He is fairly active and walks and plays golf.  Does complain of left lower extremity claudication.  He had Doppler studies performed in our office 06/06/2021 revealing a right ABI of 0.93 and a left of 0.61 with a moderate lesion in his mid right SFA and a high-grade lesion in his distal left common femoral artery. ? ?I performed peripheral angiography on him 06/27/2021 revealing an occluded distal left common femoral artery which was calcified extending into the origin of the profunda and proximal left SFA as well as a short segment mid left SFA CTO with two-vessel runoff.  I believe he would be best served by surgical revascularization of this, involving a common femoral endarterectomy with patch angioplasty as well as profundoplasty.  I am referring him to Dr. Fortunato Curling for surgical evaluation. ? ? ?Current Meds  ?Medication Sig  ? acetaminophen (TYLENOL) 500 MG tablet Take 1,500 mg by mouth daily as needed for moderate pain.  ? amLODipine (NORVASC) 10 MG tablet Take 10 mg by mouth every morning.  ? aspirin EC 81 MG tablet Take 81 mg by mouth every morning.  ? Cholecalciferol (DIALYVITE VITAMIN D 5000) 125 MCG (5000 UT) capsule Take 5,000  Units by mouth daily.  ? cyanocobalamin 2000 MCG tablet Take 2,000 mcg by mouth every Monday, Wednesday, and Friday.  ? doxazosin (CARDURA) 4 MG tablet Take 4 mg by mouth at bedtime.  ? ibuprofen (ADVIL) 200 MG tablet Take 600 mg by mouth daily as needed for moderate pain.  ? losartan (COZAAR) 100 MG tablet Take 100 mg by mouth daily.  ? Omega-3 Fatty Acids (FISH OIL) 1000 MG CAPS Take 1,000 mg by mouth daily.  ? simvastatin (ZOCOR) 20 MG tablet Take 20 mg by mouth every morning.  ? SYMBICORT 80-4.5 MCG/ACT inhaler Inhale 2 puffs into the lungs 2 (two) times daily as needed (shortness of breath).  ? ?Current Facility-Administered Medications for the 07/08/21 encounter (Office Visit) with Lorretta Harp, MD  ?Medication  ? sodium chloride flush (NS) 0.9 % injection 3 mL  ?  ? ?No Known Allergies ? ?Social History  ? ?Socioeconomic History  ? Marital status: Married  ?  Spouse name: Not on file  ? Number of children: Not on file  ? Years of education: Not on file  ? Highest education level: Not on file  ?Occupational History  ? Not on file  ?Tobacco Use  ? Smoking status: Former  ?  Types: Cigarettes  ?  Quit date: 07/13/2013  ?  Years since quitting: 7.9  ? Smokeless tobacco: Not on file  ?Substance and Sexual Activity  ? Alcohol use: Yes  ? Drug use:  No  ? Sexual activity: Not on file  ?Other Topics Concern  ? Not on file  ?Social History Narrative  ? Not on file  ? ?Social Determinants of Health  ? ?Financial Resource Strain: Not on file  ?Food Insecurity: Not on file  ?Transportation Needs: Not on file  ?Physical Activity: Not on file  ?Stress: Not on file  ?Social Connections: Not on file  ?Intimate Partner Violence: Not on file  ?  ? ?Review of Systems: ?General: negative for chills, fever, night sweats or weight changes.  ?Cardiovascular: negative for chest pain, dyspnea on exertion, edema, orthopnea, palpitations, paroxysmal nocturnal dyspnea or shortness of breath ?Dermatological: negative for  rash ?Respiratory: negative for cough or wheezing ?Urologic: negative for hematuria ?Abdominal: negative for nausea, vomiting, diarrhea, bright red blood per rectum, melena, or hematemesis ?Neurologic: negative for visual changes, syncope, or dizziness ?All other systems reviewed and are otherwise negative except as noted above. ? ? ? ?Blood pressure 115/60, pulse 61, height '5\' 9"'$  (1.753 m), weight 171 lb 6.4 oz (77.7 kg), SpO2 95 %.  ?General appearance: alert and no distress ?Neck: no adenopathy, no carotid bruit, no JVD, supple, symmetrical, trachea midline, and thyroid not enlarged, symmetric, no tenderness/mass/nodules ?Lungs: clear to auscultation bilaterally ?Heart: regular rate and rhythm, S1, S2 normal, no murmur, click, rub or gallop ?Extremities: extremities normal, atraumatic, no cyanosis or edema ?Pulses: Absent left pedal pulse ?Skin: Skin color, texture, turgor normal. No rashes or lesions ?Neurologic: Grossly normal ? ?EKG not performed today ? ?ASSESSMENT AND PLAN:  ? ?Peripheral arterial disease ([place]) ?Mr. Bumpus was referred to me by Dr. Jacqlyn Larsen for evaluation of PAD.  He did have left lower extremity claudication.  He is fairly active.  Dopplers performed in our office 06/06/2021 revealed a right ABI of 0.93 and a left of 0.61.  I performed peripheral angiography on him/20/21 revealing a 75% focal mid right SFA stenosis with three-vessel runoff and an occluded distal left common femoral artery extending into the proximal SFA and profunda as well as a small mid left SFA CTO with two-vessel runoff.  I believe he would benefit from femoral endarterectomy with patch angioplasty and profundoplasty after which we can decide whether or not he needs a mid left SFA CTO intervention.  I am going to refer him to Dr. Fortunato Curling for surgical consultation. ? ? ? ? ?Lorretta Harp MD FACP,FACC,FAHA, FSCAI ?07/08/2021 ?2:50 PM ?

## 2021-07-11 DIAGNOSIS — M47816 Spondylosis without myelopathy or radiculopathy, lumbar region: Secondary | ICD-10-CM | POA: Diagnosis not present

## 2021-07-12 ENCOUNTER — Encounter: Payer: Self-pay | Admitting: Vascular Surgery

## 2021-07-12 ENCOUNTER — Ambulatory Visit: Payer: PPO | Admitting: Vascular Surgery

## 2021-07-12 VITALS — BP 124/72 | HR 63 | Temp 98.1°F | Resp 18 | Ht 69.0 in | Wt 167.0 lb

## 2021-07-12 DIAGNOSIS — I739 Peripheral vascular disease, unspecified: Secondary | ICD-10-CM

## 2021-07-12 NOTE — Progress Notes (Signed)
? ? ?Patient name: Alex Franklin MRN: [id number] DOB: [birth year] Sex: male ? ?REASON FOR CONSULT: Evaluate for left common femoral endarterectomy   ? ?HPI: ?Alex Franklin is a 81 y.o. male, previous tobacco abuse and HTN that presents as a referral from Dr. Gwenlyn Found for evaluation of left common femoral endarterectomy.  Patient describes a burning and cramping in the left calf that starts after walking about 50 yards and he has to stop.  This has gotten much worse over the last several months.  No rest pain at night or tissue loss.  He quit smoking about 7 years ago.  He was evaluated by Dr. Gwenlyn Found.  ABI 06/06/21 were 0.93 right and 0.61 left.  He underwent angiogram on 06/27/2021 showing the left common femoral artery as well as the proximal SFA and proximal profunda occluded.  He denies any previous left lower extremity revascularizations.  [place] ? ?Past Medical History:  ?Diagnosis Date  ? Arthritis   ? GERD (gastroesophageal reflux disease)   ? Hypertension   ? Prostate cancer ([place]) 10/19/2020  ? ? ?Past Surgical History:  ?Procedure Laterality Date  ? ABDOMINAL AORTOGRAM W/LOWER EXTREMITY N/A 06/27/2021  ? Procedure: ABDOMINAL AORTOGRAM W/LOWER EXTREMITY;  Surgeon: Lorretta Harp, MD;  Location: [facility];  Service: Cardiovascular;  Laterality: N/A;  ? COLONOSCOPY WITH PROPOFOL N/A 06/07/2015  ? Procedure: COLONOSCOPY WITH PROPOFOL;  Surgeon: Garlan Fair, MD;  Location: [facility];  Service: Endoscopy;  Laterality: N/A;  ? EYE SURGERY    ? bilateral cataract surgery with lens implants  ? EYE SURGERY    ? detached retina   ? prostectomy    ? ? ?History reviewed. No pertinent family history. ? ?SOCIAL HISTORY: ?Social History  ? ?Socioeconomic History  ? Marital status: Married  ?  Spouse name: Not on file  ? Number of children: Not on file  ? Years of education: Not on file  ? Highest education level: Not on file  ?Occupational History  ? Not on  file  ?Tobacco Use  ? Smoking status: Former  ?  Types: Cigarettes  ?  Quit date: 07/13/2013  ?  Years since quitting: 8.0  ? Smokeless tobacco: Not on file  ?Substance and Sexual Activity  ? Alcohol use: Yes  ? Drug use: No  ? Sexual activity: Not on file  ?Other Topics Concern  ? Not on file  ?Social History Narrative  ? Not on file  ? ?Social Determinants of Health  ? ?Financial Resource Strain: Not on file  ?Food Insecurity: Not on file  ?Transportation Needs: Not on file  ?Physical Activity: Not on file  ?Stress: Not on file  ?Social Connections: Not on file  ?Intimate Partner Violence: Not on file  ? ? ?No Known Allergies ? ?Current Outpatient Medications  ?Medication Sig Dispense Refill  ? acetaminophen (TYLENOL) 500 MG tablet Take 1,500 mg by mouth daily as needed for moderate pain.    ? amLODipine (NORVASC) 10 MG tablet Take 10 mg by mouth every morning.    ? aspirin EC 81 MG tablet Take 81 mg by mouth every morning.    ? Cholecalciferol (DIALYVITE VITAMIN D 5000) 125 MCG (5000 UT) capsule Take 5,000 Units by mouth daily.    ? cyanocobalamin 2000 MCG tablet Take 2,000 mcg by mouth every Monday, Wednesday, and Friday.    ? doxazosin (CARDURA) 4 MG tablet Take 4 mg by mouth at  bedtime.    ? ibuprofen (ADVIL) 200 MG tablet Take 600 mg by mouth daily as needed for moderate pain.    ? losartan (COZAAR) 100 MG tablet Take 100 mg by mouth daily.    ? Omega-3 Fatty Acids (FISH OIL) 1000 MG CAPS Take 1,000 mg by mouth daily.    ? simvastatin (ZOCOR) 20 MG tablet Take 20 mg by mouth every morning.    ? SYMBICORT 80-4.5 MCG/ACT inhaler Inhale 2 puffs into the lungs 2 (two) times daily as needed (shortness of breath).    ? ?Current Facility-Administered Medications  ?Medication Dose Route Frequency Provider Last Rate Last Admin  ? sodium chloride flush (NS) 0.9 % injection 3 mL  3 mL Intravenous Q12H Lorretta Harp, MD      ? ? ?REVIEW OF SYSTEMS:  ?'[X]'$  denotes positive finding, '[ ]'$  denotes negative finding ?Cardiac   Comments:  ?Chest pain or chest pressure:    ?Shortness of breath upon exertion:    ?Short of breath when lying flat:    ?Irregular heart rhythm:    ?    ?Vascular    ?Pain in calf, thigh, or hip brought on by ambulation: x Left  ?Pain in feet at night that wakes you up from your sleep:     ?Blood clot in your veins:    ?Leg swelling:     ?    ?Pulmonary    ?Oxygen at home:    ?Productive cough:     ?Wheezing:     ?    ?Neurologic    ?Sudden weakness in arms or legs:     ?Sudden numbness in arms or legs:     ?Sudden onset of difficulty speaking or slurred speech:    ?Temporary loss of vision in one eye:     ?Problems with dizziness:     ?    ?Gastrointestinal    ?Blood in stool:     ?Vomited blood:     ?    ?Genitourinary    ?Burning when urinating:     ?Blood in urine:    ?    ?Psychiatric    ?Major depression:     ?    ?Hematologic    ?Bleeding problems:    ?Problems with blood clotting too easily:    ?    ?Skin    ?Rashes or ulcers:    ?    ?Constitutional    ?Fever or chills:    ? ? ?PHYSICAL EXAM: ?Vitals:  ? 07/12/21 1010  ?BP: 124/72  ?Pulse: 63  ?Resp: 18  ?Temp: 98.1 ?F (36.7 ?C)  ?TempSrc: Temporal  ?SpO2: (!) 61%  ?Weight: 167 lb (75.8 kg)  ?Height: '5\' 9"'$  (1.753 m)  ? ? ?GENERAL: The patient is a well-nourished male, in no acute distress. The vital signs are documented above. ?CARDIAC: There is a regular rate and rhythm.  ?VASCULAR:  ?Right femoral pulse easily palpable ?Right PT palpable ?Left femoral pulse weak ?No palpable left pedal pulse ?No lower extremity tissue loss ?PULMONARY: No respiratory distress. ?ABDOMEN: Soft and non-tender. ?MUSCULOSKELETAL: There are no major deformities or cyanosis. ?NEUROLOGIC: No focal weakness or paresthesias are detected. ?SKIN: There are no ulcers or rashes noted. ?PSYCHIATRIC: The patient has a normal affect. ? ?DATA:  ? ?ABI 06/06/21 were 0.93 right biphasic and 0.61 left monophasic.  ? ?Arteriogram imaging reviewed from 06/27/2021 that shows occluded left common  femoral artery with occlusion of the proximal profunda and SFA.  SFA reconstitutes  and there is a distal mid SFA occlusion as well. ? ?Assessment/Plan: ? 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  I have reviewed his images from Dr. Gwenlyn Found and agree he would benefit from left common femoral endarterectomy given the left common femoral occlusion as well as occlusion of the proximal profunda and SFA.  I discussed that this will likely require a longitudinal groin incision in order to get adequate exposure of the SFA for endarterectomy.  This will not address his more distal SFA occlusion.  I think with endarterectomy alone he should be much better from a symptom standpoint as it related to his claudication  Risk benefits discussed including risk of bleeding, infection, nonhealing wounds, risk of anesthesia etc.  We will get him scheduled for next Wednesday on 07/20/21. ? ? ?[name], MD ?[facility] ?Office: [phone] ? ? ? ? ?

## 2021-07-13 ENCOUNTER — Other Ambulatory Visit: Payer: Self-pay

## 2021-07-13 DIAGNOSIS — I739 Peripheral vascular disease, unspecified: Secondary | ICD-10-CM

## 2021-07-14 ENCOUNTER — Encounter (HOSPITAL_COMMUNITY): Payer: Self-pay

## 2021-07-14 ENCOUNTER — Encounter (HOSPITAL_COMMUNITY): Payer: PPO

## 2021-07-16 NOTE — Progress Notes (Signed)
Surgical Instructions ? ? ? Your procedure is scheduled on May 17. ? Report to [facility] Main Entrance "A" at 6:30 A.M., then check in with the Admitting office. ? Call this number if you have problems the morning of surgery: ? [phone] ? ? If you have any questions prior to your surgery date call [phone]: Open Monday-Friday 8am-4pm ? ? ? Remember: ? Do not eat after midnight the night before your surgery ? ?  ? Take these medicines the morning of surgery with A SIP OF WATER:  ? ?             Amlodipine (norvasc) ?             Aspirin ?              Simvastatin (zocor) ? ?             If needed: tylenol, symbicort ?               ? ? ?As of today, STOP taking Aleve, Naproxen, Ibuprofen, Motrin, Advil, Goody's, BC's, all herbal medications, fish oil, and all vitamins. ? ?         ?Do not wear jewelry or makeup ?Do not wear lotions, powders, perfumes/colognes, or deodorant. ?Do not shave 48 hours prior to surgery.  Men may shave face and neck. ?Do not bring valuables to the hospital. ?Do not wear nail polish, gel polish, artificial nails, or any other type of covering on natural nails (fingers and toes) ?If you have artificial nails or gel coating that need to be removed by a nail salon, please have this removed prior to surgery. Artificial nails or gel coating may interfere with anesthesia's ability to adequately monitor your vital signs. ? ?[facility] is not responsible for any belongings or valuables. .  ? ?Do NOT Smoke (Tobacco/Vaping)  24 hours prior to your procedure ? ?If you use a CPAP at night, you may bring your mask for your overnight stay. ?  ?Contacts, glasses, hearing aids, dentures or partials may not be worn into surgery, please bring cases for these belongings ?  ?For patients admitted to the hospital, discharge time will be determined by your treatment team. ?  ?Patients discharged the day of surgery will not be allowed to drive home, and someone needs to stay with them for 24  hours. ? ? ?SURGICAL WAITING ROOM VISITATION ?Patients having surgery or a procedure in a hospital may have two support people. ?Children under the age of 43 must have an adult with them who is not the patient. ?They may stay in the waiting area during the procedure and may switch out with other visitors. If the patient needs to stay at the hospital during part of their recovery, the visitor guidelines for inpatient rooms apply. ? ?Please refer to the [facility] website for the visitor guidelines for Inpatients (after your surgery is over and you are in a regular room).  ? ? ? ? ? ?Special instructions:   ? ?Oral Hygiene is also important to reduce your risk of infection.  Remember - BRUSH YOUR TEETH THE MORNING OF SURGERY WITH YOUR REGULAR TOOTHPASTE ? ? ?- Preparing For Surgery ? ?Before surgery, you can play an important role. Because skin is not sterile, your skin needs to be as free of germs as possible. You can reduce the number of germs on your skin by washing with CHG (chlorahexidine gluconate) Soap before surgery.  CHG is an antiseptic cleaner  which kills germs and bonds with the skin to continue killing germs even after washing.   ? ? ?Please do not use if you have an allergy to CHG or antibacterial soaps. If your skin becomes reddened/irritated stop using the CHG.  ?Do not shave (including legs and underarms) for at least 48 hours prior to first CHG shower. It is OK to shave your face. ? ?Please follow these instructions carefully. ?  ? ? Shower the NIGHT BEFORE SURGERY and the MORNING OF SURGERY with CHG Soap.  ? If you chose to wash your hair, wash your hair first as usual with your normal shampoo. After you shampoo, rinse your hair and body thoroughly to remove the shampoo.  Then [employer] and genitals (private parts) with your normal soap and rinse thoroughly to remove soap. ? ?After that Use CHG Soap as you would any other liquid soap. You can apply CHG directly to the skin and wash gently  with a scrungie or a clean washcloth.  ? ?Apply the CHG Soap to your body ONLY FROM THE NECK DOWN.  Do not use on open wounds or open sores. Avoid contact with your eyes, ears, mouth and genitals (private parts). Wash Face and genitals (private parts)  with your normal soap.  ? ?Wash thoroughly, paying special attention to the area where your surgery will be performed. ? ?Thoroughly rinse your body with warm water from the neck down. ? ?DO NOT shower/wash with your normal soap after using and rinsing off the CHG Soap. ? ?Pat yourself dry with a CLEAN TOWEL. ? ?Wear CLEAN PAJAMAS to bed the night before surgery ? ?Place CLEAN SHEETS on your bed the night before your surgery ? ?DO NOT SLEEP WITH PETS. ? ? ?Day of Surgery: ? ?Take a shower with CHG soap. ?Wear Clean/Comfortable clothing the morning of surgery ?Do not apply any deodorants/lotions.   ?Remember to brush your teeth WITH YOUR REGULAR TOOTHPASTE. ? ? ? ?If you received a COVID test during your pre-op visit, it is requested that you wear a mask when out in public, stay away from anyone that may not be feeling well, and notify your surgeon if you develop symptoms. If you have been in contact with anyone that has tested positive in the last 10 days, please notify your surgeon. ? ?  ?Please read over the following fact sheets that you were given.   ?

## 2021-07-18 ENCOUNTER — Encounter (HOSPITAL_COMMUNITY)
Admission: RE | Admit: 2021-07-18 | Discharge: 2021-07-18 | Disposition: A | Discharge status: Home / Self Care | Payer: PPO | Source: Ambulatory Visit | Attending: Vascular Surgery | Admitting: Vascular Surgery

## 2021-07-18 ENCOUNTER — Other Ambulatory Visit: Payer: Self-pay

## 2021-07-18 ENCOUNTER — Encounter (HOSPITAL_COMMUNITY): Payer: Self-pay

## 2021-07-18 VITALS — BP 107/60 | HR 68 | Temp 97.7°F | Resp 18 | Ht 69.0 in | Wt 169.4 lb

## 2021-07-18 DIAGNOSIS — Z01812 Encounter for preprocedural laboratory examination: Secondary | ICD-10-CM | POA: Insufficient documentation

## 2021-07-18 DIAGNOSIS — Z01818 Encounter for other preprocedural examination: Secondary | ICD-10-CM

## 2021-07-18 DIAGNOSIS — Z7982 Long term (current) use of aspirin: Secondary | ICD-10-CM | POA: Diagnosis not present

## 2021-07-18 DIAGNOSIS — M1990 Unspecified osteoarthritis, unspecified site: Secondary | ICD-10-CM | POA: Diagnosis not present

## 2021-07-18 DIAGNOSIS — I10 Essential (primary) hypertension: Secondary | ICD-10-CM | POA: Diagnosis not present

## 2021-07-18 DIAGNOSIS — I70212 Atherosclerosis of native arteries of extremities with intermittent claudication, left leg: Secondary | ICD-10-CM | POA: Diagnosis not present

## 2021-07-18 DIAGNOSIS — Z79899 Other long term (current) drug therapy: Secondary | ICD-10-CM | POA: Diagnosis not present

## 2021-07-18 DIAGNOSIS — D649 Anemia, unspecified: Secondary | ICD-10-CM | POA: Diagnosis not present

## 2021-07-18 DIAGNOSIS — I739 Peripheral vascular disease, unspecified: Secondary | ICD-10-CM

## 2021-07-18 DIAGNOSIS — K219 Gastro-esophageal reflux disease without esophagitis: Secondary | ICD-10-CM | POA: Diagnosis present

## 2021-07-18 DIAGNOSIS — C61 Malignant neoplasm of prostate: Secondary | ICD-10-CM | POA: Diagnosis present

## 2021-07-18 DIAGNOSIS — Z87891 Personal history of nicotine dependence: Secondary | ICD-10-CM | POA: Diagnosis not present

## 2021-07-18 LAB — CBC
HCT: 38.2 % — ABNORMAL LOW (ref 39.0–52.0)
Hemoglobin: 12.8 g/dL — ABNORMAL LOW (ref 13.0–17.0)
MCH: 32.1 pg (ref 26.0–34.0)
MCHC: 33.5 g/dL (ref 30.0–36.0)
MCV: 95.7 fL (ref 80.0–100.0)
Platelets: 223 10*3/uL (ref 150–400)
RBC: 3.99 MIL/uL — ABNORMAL LOW (ref 4.22–5.81)
RDW: 13.2 % (ref 11.5–15.5)
WBC: 6.5 10*3/uL (ref 4.0–10.5)
nRBC: 0 % (ref 0.0–0.2)

## 2021-07-18 LAB — SURGICAL PCR SCREEN
MRSA, PCR: NEGATIVE
Staphylococcus aureus: NEGATIVE

## 2021-07-18 LAB — COMPREHENSIVE METABOLIC PANEL
ALT: 18 U/L (ref 0–44)
AST: 20 U/L (ref 15–41)
Albumin: 3.7 g/dL (ref 3.5–5.0)
Alkaline Phosphatase: 73 U/L (ref 38–126)
Anion gap: 7 (ref 5–15)
BUN: 17 mg/dL (ref 8–23)
CO2: 24 mmol/L (ref 22–32)
Calcium: 9 mg/dL (ref 8.9–10.3)
Chloride: 105 mmol/L (ref 98–111)
Creatinine, Ser: 1.36 mg/dL — ABNORMAL HIGH (ref 0.61–1.24)
GFR, Estimated: 53 mL/min — ABNORMAL LOW (ref >60)
Glucose, Bld: 95 mg/dL (ref 70–99)
Potassium: 3.9 mmol/L (ref 3.5–5.1)
Sodium: 136 mmol/L (ref 135–145)
Total Bilirubin: 0.7 mg/dL (ref 0.3–1.2)
Total Protein: 6.8 g/dL (ref 6.5–8.1)

## 2021-07-18 LAB — URINALYSIS, ROUTINE W REFLEX MICROSCOPIC
Bilirubin Urine: NEGATIVE
Glucose, UA: NEGATIVE mg/dL
Hgb urine dipstick: NEGATIVE
Ketones, ur: NEGATIVE mg/dL
Leukocytes,Ua: NEGATIVE
Nitrite: NEGATIVE
Protein, ur: NEGATIVE mg/dL
Specific Gravity, Urine: 1.018 (ref 1.005–1.030)
pH: 5 (ref 5.0–8.0)

## 2021-07-18 LAB — PROTIME-INR
INR: 1 (ref 0.8–1.2)
Prothrombin Time: 13.2 seconds (ref 11.4–15.2)

## 2021-07-18 LAB — TYPE AND SCREEN
ABO/RH(D): O POS
Antibody Screen: NEGATIVE

## 2021-07-18 LAB — APTT: aPTT: 36 seconds (ref 24–36)

## 2021-07-18 NOTE — Progress Notes (Signed)
PCP - Wenda Low, MD ?Cardiologist -Werner Lean, MD and Dr. [name]- pt denies any cardiac issues or cardiac symptoms.  ? ?PPM/ICD - denies ?Chest x-ray - N/A ?EKG - 06/24/21 ?Stress Test - denies ?ECHO - denies ?Cardiac Cath - denies ? ?Sleep Study - denies ? ?Aspirin Instructions: Follow your surgeon's instructions on whether to stop or continue taking Aspirin.  If no instructions were given by your surgeon then you will need to call the office to get those instructions.   ?  ?ERAS Protcol - NPO order ?COVID TEST- N/A ? ? ?Anesthesia review: no ? ?Patient denies shortness of breath, fever, cough and chest pain at PAT appointment ? ? ?All instructions explained to the patient, with a verbal understanding of the material. Patient agrees to go over the instructions while at home for a better understanding. The opportunity to ask questions was provided. ? ? ?

## 2021-07-18 NOTE — Progress Notes (Addendum)
Surgical Instructions ? ? ? Your procedure is scheduled on Wednesday, May 17. ? Report to [facility] Main Entrance "A" at 6:30 A.M., then check in with the Admitting office. ? Call this number if you have problems the morning of surgery: ? [phone] ? ? If you have any questions prior to your surgery date call [phone]: Open Monday-Friday 8am-4pm ? ? ? Remember: ? Do not eat or drink after midnight the night before your surgery ? ?  ? Take these medicines the morning of surgery with A SIP OF WATER:  ? ?             Amlodipine (norvasc) ?             Simvastatin (zocor) ? ?             If needed: tylenol, symbicort ? ?Please bring all inhalers with you the day of surgery.  ? ?Follow your surgeon's instructions on whether to stop or continue taking Aspirin.  If no instructions were given by your surgeon then you will need to call the office to get those instructions.   ? ?As of today, STOP taking Aleve, Naproxen, Ibuprofen, Motrin, Advil, Goody's, BC's, all herbal medications, fish oil, and all vitamins. ? ?         ?Do not wear jewelry or makeup ?Do not wear lotions, powders, perfumes/colognes, or deodorant. ?Do not shave 48 hours prior to surgery.  Men may shave face and neck. ?Do not bring valuables to the hospital. ?Do not wear nail polish, gel polish, artificial nails, or any other type of covering on natural nails (fingers and toes) ?If you have artificial nails or gel coating that need to be removed by a nail salon, please have this removed prior to surgery. Artificial nails or gel coating may interfere with anesthesia's ability to adequately monitor your vital signs. ? ?[facility] is not responsible for any belongings or valuables. .  ? ?Do NOT Smoke (Tobacco/Vaping)  24 hours prior to your procedure ? ?If you use a CPAP at night, you may bring your mask for your overnight stay. ?  ?Contacts, glasses, hearing aids, dentures or partials may not be worn into surgery, please bring cases for these  belongings ?  ?For patients admitted to the hospital, discharge time will be determined by your treatment team. ?  ?Patients discharged the day of surgery will not be allowed to drive home, and someone needs to stay with them for 24 hours. ? ? ?SURGICAL WAITING ROOM VISITATION ?Patients having surgery or a procedure in a hospital may have two support people. ?Children under the age of 64 must have an adult with them who is not the patient. ?They may stay in the waiting area during the procedure and may switch out with other visitors. If the patient needs to stay at the hospital during part of their recovery, the visitor guidelines for inpatient rooms apply. ? ?Please refer to the [facility] website for the visitor guidelines for Inpatients (after your surgery is over and you are in a regular room).  ? ? ? ? ? ?Special instructions:   ? ?Oral Hygiene is also important to reduce your risk of infection.  Remember - BRUSH YOUR TEETH THE MORNING OF SURGERY WITH YOUR REGULAR TOOTHPASTE ? ? ?[facility]- Preparing For Surgery ? ?Before surgery, you can play an important role. Because skin is not sterile, your skin needs to be as free of germs as possible. You can reduce the number  of germs on your skin by washing with CHG (chlorahexidine gluconate) Soap before surgery.  CHG is an antiseptic cleaner which kills germs and bonds with the skin to continue killing germs even after washing.   ? ? ?Please do not use if you have an allergy to CHG or antibacterial soaps. If your skin becomes reddened/irritated stop using the CHG.  ?Do not shave (including legs and underarms) for at least 48 hours prior to first CHG shower. It is OK to shave your face. ? ?Please follow these instructions carefully. ?  ? ? Shower the NIGHT BEFORE SURGERY and the MORNING OF SURGERY with CHG Soap.  ? If you chose to wash your hair, wash your hair first as usual with your normal shampoo. After you shampoo, rinse your hair and body thoroughly to remove  the shampoo.  Then [employer] and genitals (private parts) with your normal soap and rinse thoroughly to remove soap. ? ?After that Use CHG Soap as you would any other liquid soap. You can apply CHG directly to the skin and wash gently with a scrungie or a clean washcloth.  ? ?Apply the CHG Soap to your body ONLY FROM THE NECK DOWN.  Do not use on open wounds or open sores. Avoid contact with your eyes, ears, mouth and genitals (private parts). Wash Face and genitals (private parts)  with your normal soap.  ? ?Wash thoroughly, paying special attention to the area where your surgery will be performed. ? ?Thoroughly rinse your body with warm water from the neck down. ? ?DO NOT shower/wash with your normal soap after using and rinsing off the CHG Soap. ? ?Pat yourself dry with a CLEAN TOWEL. ? ?Wear CLEAN PAJAMAS to bed the night before surgery ? ?Place CLEAN SHEETS on your bed the night before your surgery ? ?DO NOT SLEEP WITH PETS. ? ? ?Day of Surgery: ? ?Take a shower with CHG soap. ?Wear Clean/Comfortable clothing the morning of surgery ?Do not apply any deodorants/lotions.   ?Remember to brush your teeth WITH YOUR REGULAR TOOTHPASTE. ? ? ? ?If you received a COVID test during your pre-op visit, it is requested that you wear a mask when out in public, stay away from anyone that may not be feeling well, and notify your surgeon if you develop symptoms. If you have been in contact with anyone that has tested positive in the last 10 days, please notify your surgeon. ? ?  ?Please read over the following fact sheets that you were given.   ?

## 2021-07-20 ENCOUNTER — Inpatient Hospital Stay (HOSPITAL_COMMUNITY): Payer: PPO | Admitting: Certified Registered"

## 2021-07-20 ENCOUNTER — Other Ambulatory Visit: Payer: Self-pay

## 2021-07-20 ENCOUNTER — Inpatient Hospital Stay (HOSPITAL_COMMUNITY)
Admission: RE | Admit: 2021-07-20 | Discharge: 2021-07-21 | DRG: 254 | Disposition: A | Discharge status: Home / Self Care | Payer: PPO | Attending: Vascular Surgery | Admitting: Vascular Surgery

## 2021-07-20 ENCOUNTER — Encounter (HOSPITAL_COMMUNITY)
Admission: RE | Disposition: A | Discharge status: Home / Self Care | Payer: Self-pay | Source: Home / Self Care | Attending: Vascular Surgery

## 2021-07-20 ENCOUNTER — Encounter (HOSPITAL_COMMUNITY): Payer: Self-pay | Admitting: Vascular Surgery

## 2021-07-20 DIAGNOSIS — C61 Malignant neoplasm of prostate: Secondary | ICD-10-CM | POA: Diagnosis present

## 2021-07-20 DIAGNOSIS — M1990 Unspecified osteoarthritis, unspecified site: Secondary | ICD-10-CM | POA: Diagnosis not present

## 2021-07-20 DIAGNOSIS — Z87891 Personal history of nicotine dependence: Secondary | ICD-10-CM | POA: Diagnosis not present

## 2021-07-20 DIAGNOSIS — Z7982 Long term (current) use of aspirin: Secondary | ICD-10-CM

## 2021-07-20 DIAGNOSIS — I739 Peripheral vascular disease, unspecified: Secondary | ICD-10-CM

## 2021-07-20 DIAGNOSIS — Z79899 Other long term (current) drug therapy: Secondary | ICD-10-CM

## 2021-07-20 DIAGNOSIS — I10 Essential (primary) hypertension: Secondary | ICD-10-CM | POA: Diagnosis present

## 2021-07-20 DIAGNOSIS — D649 Anemia, unspecified: Secondary | ICD-10-CM | POA: Diagnosis not present

## 2021-07-20 DIAGNOSIS — I70212 Atherosclerosis of native arteries of extremities with intermittent claudication, left leg: Secondary | ICD-10-CM | POA: Diagnosis present

## 2021-07-20 DIAGNOSIS — K219 Gastro-esophageal reflux disease without esophagitis: Secondary | ICD-10-CM | POA: Diagnosis present

## 2021-07-20 HISTORY — PX: PATCH ANGIOPLASTY: SHX6230

## 2021-07-20 HISTORY — PX: ENDARTERECTOMY FEMORAL: SHX5804

## 2021-07-20 LAB — CBC
HCT: 37 % — ABNORMAL LOW (ref 39.0–52.0)
Hemoglobin: 12.3 g/dL — ABNORMAL LOW (ref 13.0–17.0)
MCH: 31.6 pg (ref 26.0–34.0)
MCHC: 33.2 g/dL (ref 30.0–36.0)
MCV: 95.1 fL (ref 80.0–100.0)
Platelets: 247 10*3/uL (ref 150–400)
RBC: 3.89 MIL/uL — ABNORMAL LOW (ref 4.22–5.81)
RDW: 13.2 % (ref 11.5–15.5)
WBC: 8 10*3/uL (ref 4.0–10.5)
nRBC: 0 % (ref 0.0–0.2)

## 2021-07-20 LAB — CREATININE, SERUM
Creatinine, Ser: 1.32 mg/dL — ABNORMAL HIGH (ref 0.61–1.24)
GFR, Estimated: 55 mL/min — ABNORMAL LOW (ref >60)

## 2021-07-20 LAB — POCT ACTIVATED CLOTTING TIME
Activated Clotting Time: 269 seconds
Activated Clotting Time: 348 seconds

## 2021-07-20 LAB — ABO/RH: ABO/RH(D): O POS

## 2021-07-20 SURGERY — ENDARTERECTOMY, FEMORAL
Anesthesia: General | Site: Groin | Laterality: Left

## 2021-07-20 MED ORDER — HEMOSTATIC AGENTS (NO CHARGE) OPTIME
TOPICAL | Status: DC | PRN
  Administered 2021-07-20: 1 via TOPICAL

## 2021-07-20 MED ORDER — AMLODIPINE BESYLATE 10 MG PO TABS
10.0000 mg | ORAL_TABLET | Freq: Every morning | ORAL | Status: DC
  Administered 2021-07-21: 10 mg via ORAL
  Filled 2021-07-20: qty 1

## 2021-07-20 MED ORDER — FENTANYL CITRATE (PF) 100 MCG/2ML IJ SOLN
25.0000 ug | INTRAMUSCULAR | Status: DC | PRN
  Administered 2021-07-20 (×2): 25 ug via INTRAVENOUS

## 2021-07-20 MED ORDER — ONDANSETRON HCL 4 MG/2ML IJ SOLN: 4.0000 mg | Freq: Once | INTRAMUSCULAR | Status: DC | PRN

## 2021-07-20 MED ORDER — ORAL CARE MOUTH RINSE: 15.0000 mL | Freq: Once | OROMUCOSAL | Status: AC

## 2021-07-20 MED ORDER — OXYCODONE-ACETAMINOPHEN 5-325 MG PO TABS
1.0000 | ORAL_TABLET | ORAL | Status: DC | PRN
  Administered 2021-07-20 (×2): 1 via ORAL
  Administered 2021-07-21: 2 via ORAL
  Filled 2021-07-20: qty 2
  Filled 2021-07-20 (×2): qty 1

## 2021-07-20 MED ORDER — CHLORHEXIDINE GLUCONATE 0.12 % MT SOLN: 15.0000 mL | Freq: Once | OROMUCOSAL | Status: AC

## 2021-07-20 MED ORDER — LIDOCAINE 2% (20 MG/ML) 5 ML SYRINGE
INTRAMUSCULAR | Status: DC | PRN
  Administered 2021-07-20: 60 mg via INTRAVENOUS

## 2021-07-20 MED ORDER — PHENYLEPHRINE HCL-NACL 20-0.9 MG/250ML-% IV SOLN
INTRAVENOUS | Status: DC | PRN
  Administered 2021-07-20: 3 ug/min via INTRAVENOUS

## 2021-07-20 MED ORDER — ONDANSETRON HCL 4 MG/2ML IJ SOLN
INTRAMUSCULAR | Status: DC | PRN
Start: 2021-07-20 — End: 2021-07-20
  Administered 2021-07-20: 4 mg via INTRAVENOUS

## 2021-07-20 MED ORDER — HEPARIN SODIUM (PORCINE) 1000 UNIT/ML IJ SOLN
INTRAMUSCULAR | Status: DC | PRN
  Administered 2021-07-20: 8000 [IU] via INTRAVENOUS

## 2021-07-20 MED ORDER — VITAMIN B-12 1000 MCG PO TABS
2000.0000 ug | ORAL_TABLET | ORAL | Status: DC
  Administered 2021-07-20: 2000 ug via ORAL
  Filled 2021-07-20: qty 2

## 2021-07-20 MED ORDER — MAGNESIUM SULFATE 2 GM/50ML IV SOLN: 2.0000 g | Freq: Every day | INTRAVENOUS | Status: DC | PRN

## 2021-07-20 MED ORDER — 0.9 % SODIUM CHLORIDE (POUR BTL) OPTIME
TOPICAL | Status: DC | PRN
  Administered 2021-07-20 (×2): 1000 mL

## 2021-07-20 MED ORDER — LOSARTAN POTASSIUM 50 MG PO TABS
100.0000 mg | ORAL_TABLET | Freq: Every day | ORAL | Status: DC
  Administered 2021-07-21: 100 mg via ORAL
  Filled 2021-07-20 (×2): qty 2

## 2021-07-20 MED ORDER — MOMETASONE FURO-FORMOTEROL FUM 100-5 MCG/ACT IN AERO
2.0000 | INHALATION_SPRAY | Freq: Two times a day (BID) | RESPIRATORY_TRACT | Status: DC
Start: 2021-07-20 — End: 2021-07-21
  Filled 2021-07-20: qty 8.8

## 2021-07-20 MED ORDER — HEPARIN 6000 UNIT IRRIGATION SOLUTION
Status: DC | PRN
  Administered 2021-07-20: 1

## 2021-07-20 MED ORDER — DOXAZOSIN MESYLATE 2 MG PO TABS
4.0000 mg | ORAL_TABLET | Freq: Every day | ORAL | Status: DC
  Administered 2021-07-20: 4 mg via ORAL
  Filled 2021-07-20: qty 2

## 2021-07-20 MED ORDER — ROCURONIUM BROMIDE 10 MG/ML (PF) SYRINGE
PREFILLED_SYRINGE | INTRAVENOUS | Status: DC | PRN
Start: 2021-07-20 — End: 2021-07-20
  Administered 2021-07-20: 20 mg via INTRAVENOUS
  Administered 2021-07-20: 60 mg via INTRAVENOUS

## 2021-07-20 MED ORDER — STERILE WATER FOR IRRIGATION IR SOLN
Status: DC | PRN
  Administered 2021-07-20: 1000 mL

## 2021-07-20 MED ORDER — AMISULPRIDE (ANTIEMETIC) 5 MG/2ML IV SOLN: 10.0000 mg | Freq: Once | INTRAVENOUS | Status: DC | PRN

## 2021-07-20 MED ORDER — SODIUM CHLORIDE 0.9 % IV SOLN: INTRAVENOUS | Status: DC

## 2021-07-20 MED ORDER — ACETAMINOPHEN 650 MG RE SUPP: 325.0000 mg | RECTAL | Status: DC | PRN

## 2021-07-20 MED ORDER — FENTANYL CITRATE (PF) 250 MCG/5ML IJ SOLN
INTRAMUSCULAR | Status: DC | PRN
  Administered 2021-07-20: 50 ug via INTRAVENOUS
  Administered 2021-07-20: 150 ug via INTRAVENOUS
  Administered 2021-07-20: 50 ug via INTRAVENOUS

## 2021-07-20 MED ORDER — CEFAZOLIN SODIUM-DEXTROSE 2-4 GM/100ML-% IV SOLN
2.0000 g | Freq: Three times a day (TID) | INTRAVENOUS | Status: AC
  Administered 2021-07-21: 2 g via INTRAVENOUS
  Filled 2021-07-20 (×2): qty 100

## 2021-07-20 MED ORDER — PROTAMINE SULFATE 10 MG/ML IV SOLN
INTRAVENOUS | Status: DC | PRN
  Administered 2021-07-20: 40 mg via INTRAVENOUS
  Administered 2021-07-20: 10 mg via INTRAVENOUS

## 2021-07-20 MED ORDER — HEPARIN SODIUM (PORCINE) 5000 UNIT/ML IJ SOLN
5000.0000 [IU] | Freq: Three times a day (TID) | INTRAMUSCULAR | Status: DC
  Administered 2021-07-21: 5000 [IU] via SUBCUTANEOUS
  Filled 2021-07-20: qty 1

## 2021-07-20 MED ORDER — LACTATED RINGERS IV SOLN: INTRAVENOUS | Status: DC

## 2021-07-20 MED ORDER — CHOLECALCIFEROL 125 MCG (5000 UT) PO CAPS: 5000.0000 [IU] | ORAL_CAPSULE | Freq: Every day | ORAL | Status: DC

## 2021-07-20 MED ORDER — CEFAZOLIN SODIUM-DEXTROSE 2-4 GM/100ML-% IV SOLN
2.0000 g | INTRAVENOUS | Status: AC
  Administered 2021-07-20: 2 g via INTRAVENOUS

## 2021-07-20 MED ORDER — CHLORHEXIDINE GLUCONATE CLOTH 2 % EX PADS
6.0000 | MEDICATED_PAD | Freq: Once | CUTANEOUS | Status: DC
Start: 2021-07-20 — End: 2021-07-20

## 2021-07-20 MED ORDER — POTASSIUM CHLORIDE CRYS ER 20 MEQ PO TBCR: 20.0000 meq | EXTENDED_RELEASE_TABLET | Freq: Every day | ORAL | Status: DC | PRN

## 2021-07-20 MED ORDER — CHLORHEXIDINE GLUCONATE 0.12 % MT SOLN
OROMUCOSAL | Status: AC
  Administered 2021-07-20: 15 mL via OROMUCOSAL
  Filled 2021-07-20: qty 15

## 2021-07-20 MED ORDER — PROPOFOL 10 MG/ML IV BOLUS
INTRAVENOUS | Status: DC | PRN
  Administered 2021-07-20: 150 mg via INTRAVENOUS
  Administered 2021-07-20: 50 mg via INTRAVENOUS

## 2021-07-20 MED ORDER — GUAIFENESIN-DM 100-10 MG/5ML PO SYRP: 15.0000 mL | ORAL_SOLUTION | ORAL | Status: DC | PRN

## 2021-07-20 MED ORDER — SIMVASTATIN 20 MG PO TABS
20.0000 mg | ORAL_TABLET | Freq: Every morning | ORAL | Status: DC
  Administered 2021-07-21: 20 mg via ORAL
  Filled 2021-07-20: qty 1

## 2021-07-20 MED ORDER — PROPOFOL 10 MG/ML IV BOLUS
INTRAVENOUS | Status: AC
  Filled 2021-07-20: qty 20

## 2021-07-20 MED ORDER — ASPIRIN EC 81 MG PO TBEC
81.0000 mg | DELAYED_RELEASE_TABLET | Freq: Every morning | ORAL | Status: DC
  Administered 2021-07-21: 81 mg via ORAL
  Filled 2021-07-20: qty 1

## 2021-07-20 MED ORDER — METOPROLOL TARTRATE 5 MG/5ML IV SOLN: 2.0000 mg | INTRAVENOUS | Status: DC | PRN

## 2021-07-20 MED ORDER — CEFAZOLIN SODIUM-DEXTROSE 2-4 GM/100ML-% IV SOLN
INTRAVENOUS | Status: AC
  Administered 2021-07-20: 2 g via INTRAVENOUS
  Filled 2021-07-20: qty 100

## 2021-07-20 MED ORDER — FENTANYL CITRATE (PF) 100 MCG/2ML IJ SOLN
INTRAMUSCULAR | Status: AC
  Filled 2021-07-20: qty 2

## 2021-07-20 MED ORDER — PANTOPRAZOLE SODIUM 40 MG PO TBEC
40.0000 mg | DELAYED_RELEASE_TABLET | Freq: Every day | ORAL | Status: DC
  Administered 2021-07-21: 40 mg via ORAL
  Filled 2021-07-20: qty 1

## 2021-07-20 MED ORDER — BISACODYL 10 MG RE SUPP: 10.0000 mg | Freq: Every day | RECTAL | Status: DC | PRN

## 2021-07-20 MED ORDER — POLYETHYLENE GLYCOL 3350 17 G PO PACK: 17.0000 g | PACK | Freq: Every day | ORAL | Status: DC | PRN

## 2021-07-20 MED ORDER — FENTANYL CITRATE (PF) 250 MCG/5ML IJ SOLN
INTRAMUSCULAR | Status: AC
  Filled 2021-07-20: qty 5

## 2021-07-20 MED ORDER — LABETALOL HCL 5 MG/ML IV SOLN: 10.0000 mg | INTRAVENOUS | Status: DC | PRN

## 2021-07-20 MED ORDER — SUGAMMADEX SODIUM 200 MG/2ML IV SOLN
INTRAVENOUS | Status: DC | PRN
  Administered 2021-07-20: 200 mg via INTRAVENOUS

## 2021-07-20 MED ORDER — ALUM & MAG HYDROXIDE-SIMETH 200-200-20 MG/5ML PO SUSP: 15.0000 mL | ORAL | Status: DC | PRN

## 2021-07-20 MED ORDER — EPHEDRINE SULFATE-NACL 50-0.9 MG/10ML-% IV SOSY
PREFILLED_SYRINGE | INTRAVENOUS | Status: DC | PRN
  Administered 2021-07-20 (×5): 5 mg via INTRAVENOUS

## 2021-07-20 MED ORDER — ACETAMINOPHEN 325 MG PO TABS: 325.0000 mg | ORAL_TABLET | ORAL | Status: DC | PRN

## 2021-07-20 MED ORDER — ACETAMINOPHEN 500 MG PO TABS
1000.0000 mg | ORAL_TABLET | Freq: Once | ORAL | Status: AC
  Administered 2021-07-20: 1000 mg via ORAL
  Filled 2021-07-20: qty 2

## 2021-07-20 MED ORDER — SODIUM CHLORIDE 0.9 % IV SOLN
500.0000 mL | Freq: Once | INTRAVENOUS | Status: AC | PRN
  Administered 2021-07-20: 500 mL via INTRAVENOUS

## 2021-07-20 MED ORDER — DEXAMETHASONE SODIUM PHOSPHATE 10 MG/ML IJ SOLN
INTRAMUSCULAR | Status: DC | PRN
  Administered 2021-07-20: 10 mg via INTRAVENOUS

## 2021-07-20 MED ORDER — MORPHINE SULFATE (PF) 2 MG/ML IV SOLN: 2.0000 mg | INTRAVENOUS | Status: DC | PRN

## 2021-07-20 MED ORDER — PHENOL 1.4 % MT LIQD: 1.0000 | OROMUCOSAL | Status: DC | PRN

## 2021-07-20 MED ORDER — DOCUSATE SODIUM 100 MG PO CAPS
100.0000 mg | ORAL_CAPSULE | Freq: Every day | ORAL | Status: DC
  Filled 2021-07-20: qty 1

## 2021-07-20 MED ORDER — CHLORHEXIDINE GLUCONATE CLOTH 2 % EX PADS: 6.0000 | MEDICATED_PAD | Freq: Once | CUTANEOUS | Status: DC

## 2021-07-20 MED ORDER — HYDRALAZINE HCL 20 MG/ML IJ SOLN: 5.0000 mg | INTRAMUSCULAR | Status: DC | PRN

## 2021-07-20 MED ORDER — ONDANSETRON HCL 4 MG/2ML IJ SOLN: 4.0000 mg | Freq: Four times a day (QID) | INTRAMUSCULAR | Status: DC | PRN

## 2021-07-20 MED ORDER — HEPARIN 6000 UNIT IRRIGATION SOLUTION
Status: AC
  Filled 2021-07-20: qty 500

## 2021-07-20 SURGICAL SUPPLY — 41 items
ADH SKN CLS APL DERMABOND .7 (GAUZE/BANDAGES/DRESSINGS) ×1
AGENT HMST SPONGE THK3/8 (HEMOSTASIS)
BAG COUNTER SPONGE SURGICOUNT (BAG) ×3 IMPLANT
BAG SPNG CNTER NS LX DISP (BAG) ×1
CANISTER SUCT 3000ML PPV (MISCELLANEOUS) ×3 IMPLANT
CLIP VESOCCLUDE MED 24/CT (CLIP) ×3 IMPLANT
CLIP VESOCCLUDE SM WIDE 24/CT (CLIP) ×3 IMPLANT
DERMABOND ADVANCED (GAUZE/BANDAGES/DRESSINGS) ×1
DERMABOND ADVANCED .7 DNX12 (GAUZE/BANDAGES/DRESSINGS) ×2 IMPLANT
DRAIN CHANNEL 15F RND FF W/TCR (WOUND CARE) IMPLANT
ELECT REM PT RETURN 9FT ADLT (ELECTROSURGICAL) ×2
ELECTRODE REM PT RTRN 9FT ADLT (ELECTROSURGICAL) ×2 IMPLANT
EVACUATOR SILICONE 100CC (DRAIN) IMPLANT
GAUZE 4X4 16PLY ~~LOC~~+RFID DBL (SPONGE) ×2 IMPLANT
GLOVE BIO SURGEON STRL SZ7.5 (GLOVE) ×3 IMPLANT
GLOVE BIOGEL PI IND STRL 8 (GLOVE) ×2 IMPLANT
GLOVE BIOGEL PI INDICATOR 8 (GLOVE) ×1
GOWN STRL REUS W/ TWL LRG LVL3 (GOWN DISPOSABLE) ×6 IMPLANT
GOWN STRL REUS W/ TWL XL LVL3 (GOWN DISPOSABLE) ×4 IMPLANT
GOWN STRL REUS W/TWL LRG LVL3 (GOWN DISPOSABLE) ×4
GOWN STRL REUS W/TWL XL LVL3 (GOWN DISPOSABLE) ×2
GRAFT VASC PATCH XENOSURE 1X14 (Vascular Products) ×1 IMPLANT
HEMOSTAT SNOW SURGICEL 2X4 (HEMOSTASIS) ×1 IMPLANT
HEMOSTAT SPONGE AVITENE ULTRA (HEMOSTASIS) IMPLANT
KIT BASIN OR (CUSTOM PROCEDURE TRAY) ×3 IMPLANT
KIT TURNOVER KIT B (KITS) ×3 IMPLANT
NS IRRIG 1000ML POUR BTL (IV SOLUTION) ×6 IMPLANT
PACK PERIPHERAL VASCULAR (CUSTOM PROCEDURE TRAY) ×3 IMPLANT
PAD ARMBOARD 7.5X6 YLW CONV (MISCELLANEOUS) ×6 IMPLANT
SPONGE T-LAP 18X18 ~~LOC~~+RFID (SPONGE) ×1 IMPLANT
SUT MNCRL AB 4-0 PS2 18 (SUTURE) ×3 IMPLANT
SUT PROLENE 5 0 C 1 24 (SUTURE) ×4 IMPLANT
SUT PROLENE 6 0 BV (SUTURE) ×4 IMPLANT
SUT VIC AB 2-0 CT1 27 (SUTURE) ×4
SUT VIC AB 2-0 CT1 TAPERPNT 27 (SUTURE) ×2 IMPLANT
SUT VIC AB 3-0 SH 27 (SUTURE) ×4
SUT VIC AB 3-0 SH 27X BRD (SUTURE) ×2 IMPLANT
TOWEL GREEN STERILE (TOWEL DISPOSABLE) ×3 IMPLANT
TRAY FOLEY MTR SLVR 16FR STAT (SET/KITS/TRAYS/PACK) IMPLANT
UNDERPAD 30X36 HEAVY ABSORB (UNDERPADS AND DIAPERS) ×3 IMPLANT
WATER STERILE IRR 1000ML POUR (IV SOLUTION) ×3 IMPLANT

## 2021-07-20 NOTE — Transfer of Care (Signed)
Immediate Anesthesia Transfer of Care Note ? ?Patient: Alex Franklin ? ?Procedure(s) Performed: LEFT COMMON FEMORAL ENDARTERECTOMY (Left: Groin) ?PATCH PROFUNDAPLASTY WITH 1cm x 14cm XENOSURE BIOLOGIC PATCH (Left: Groin) ? ?Patient Location: PACU ? ?Anesthesia Type:General ? ?Level of Consciousness: awake, alert  and oriented ? ?Airway & Oxygen Therapy: Patient Spontanous Breathing ? ?Post-op Assessment: Report given to RN and Post -op Vital signs reviewed and stable ? ?Post vital signs: Reviewed and stable ? ?Last Vitals:  ?Vitals Value Taken Time  ?BP 94/49 07/20/21 1106  ?Temp    ?Pulse 67 07/20/21 1108  ?Resp 11 07/20/21 1108  ?SpO2 95 % 07/20/21 1108  ?Vitals shown include unvalidated device data. ? ?Last Pain:  ?Vitals:  ? 07/20/21 0719  ?TempSrc:   ?PainSc: 0-No pain  ?   ? ?  ? ?Complications: No notable events documented. ?

## 2021-07-20 NOTE — Progress Notes (Signed)
?  Day of Surgery Note ? ? ? ?Subjective:  in recovery; no complaints ? ? ?Vitals:  ? 07/20/21 1105 07/20/21 1120  ?BP:  (!) 104/49  ?Pulse:  69  ?Resp: 12 15  ?Temp: 98 ?F (36.7 ?C)   ?SpO2:    ? ? ?Incisions:   left groin incision is clean; no hematoma present ?Extremities:  brisk left PT doppler signal; monophasic left DP/pero ?Cardiac:  regular ?Lungs:  non labored ? ? ?Assessment/Plan:  This is a 81 y.o. male who is s/p  ?Left femoral endarterectomy with bovine patch angioplasty ? ?-pt doing well in recovery with brisk left PT doppler signal; monophasic left DP/pero. ?-left groin looks good with no hematoma ?-to 4 east later today ? ? ?Leontine Locket, PA-C ?07/20/2021 ?11:29 AM ?[phone] ? ?

## 2021-07-20 NOTE — H&P (Signed)
History and Physical Interval Note: ? ?07/20/2021 ?8:16 AM ? ?Alex Franklin  has presented today for surgery, with the diagnosis of PAD.  The various methods of treatment have been discussed with the patient and family. After consideration of risks, benefits and other options for treatment, the patient has consented to  Procedure(s): ?LEFT COMMON FEMORAL ENDARTERECTOMY (Left) as a surgical intervention.  The patient's history has been reviewed, patient examined, no change in status, stable for surgery.  I have reviewed the patient's chart and labs.  Questions were answered to the patient's satisfaction.   ? ?Left femoral endarterectomy.   ? ?Alex Franklin ? ?Patient name: Alex Franklin        MRN: [id number]        DOB: [birth year]            Sex: male ?  ?REASON FOR CONSULT: Evaluate for left common femoral endarterectomy   ?  ?HPI: ?Alex Franklin is a 81 y.o. male, previous tobacco abuse and HTN that presents as a referral from Dr. Gwenlyn Found for evaluation of left common femoral endarterectomy.  Patient describes a burning and cramping in the left calf that starts after walking about 50 yards and he has to stop.  This has gotten much worse over the last several months.  No rest pain at night or tissue loss.  He quit smoking about 7 years ago.  He was evaluated by Dr. Gwenlyn Found.  ABI 06/06/21 were 0.93 right and 0.61 left.  He underwent angiogram on 06/27/2021 showing the left common femoral artery as well as the proximal SFA and proximal profunda occluded.  He denies any previous left lower extremity revascularizations.  [place] ?  ?    ?Past Medical History:  ?Diagnosis Date  ? Arthritis    ? GERD (gastroesophageal reflux disease)    ? Hypertension    ? Prostate cancer ([place]) 10/19/2020  ?  ?  ?     ?Past Surgical History:  ?Procedure Laterality Date  ? ABDOMINAL AORTOGRAM W/LOWER EXTREMITY N/A 06/27/2021  ?  Procedure: ABDOMINAL AORTOGRAM W/LOWER EXTREMITY;   Surgeon: Lorretta Harp, MD;  Location: [facility];  Service: Cardiovascular;  Laterality: N/A;  ? COLONOSCOPY WITH PROPOFOL N/A 06/07/2015  ?  Procedure: COLONOSCOPY WITH PROPOFOL;  Surgeon: Garlan Fair, MD;  Location: [facility];  Service: Endoscopy;  Laterality: N/A;  ? EYE SURGERY      ?  bilateral cataract surgery with lens implants  ? EYE SURGERY      ?  detached retina   ? prostectomy      ?  ?  ?History reviewed. No pertinent family history. ?  ?SOCIAL HISTORY: ?Social History  ?  ?     ?Socioeconomic History  ? Marital status: Married  ?    Spouse name: Not on file  ? Number of children: Not on file  ? Years of education: Not on file  ? Highest education level: Not on file  ?Occupational History  ? Not on file  ?Tobacco Use  ? Smoking status: Former  ?    Types: Cigarettes  ?    Quit date: 07/13/2013  ?    Years since quitting: 8.0  ? Smokeless tobacco: Not on file  ?Substance and Sexual Activity  ? Alcohol use: Yes  ? Drug use: No  ? Sexual activity: Not on file  ?Other Topics Concern  ? Not on file  ?  Social History Narrative  ? Not on file  ?  ?Social Determinants of Health  ?  ?Financial Resource Strain: Not on file  ?Food Insecurity: Not on file  ?Transportation Needs: Not on file  ?Physical Activity: Not on file  ?Stress: Not on file  ?Social Connections: Not on file  ?Intimate Partner Violence: Not on file  ?  ?  ?No Known Allergies ?  ?      ?Current Outpatient Medications  ?Medication Sig Dispense Refill  ? acetaminophen (TYLENOL) 500 MG tablet Take 1,500 mg by mouth daily as needed for moderate pain.      ? amLODipine (NORVASC) 10 MG tablet Take 10 mg by mouth every morning.      ? aspirin EC 81 MG tablet Take 81 mg by mouth every morning.      ? Cholecalciferol (DIALYVITE VITAMIN D 5000) 125 MCG (5000 UT) capsule Take 5,000 Units by mouth daily.      ? cyanocobalamin 2000 MCG tablet Take 2,000 mcg by mouth every Monday, Wednesday, and Friday.      ? doxazosin (CARDURA) 4 MG tablet  Take 4 mg by mouth at bedtime.      ? ibuprofen (ADVIL) 200 MG tablet Take 600 mg by mouth daily as needed for moderate pain.      ? losartan (COZAAR) 100 MG tablet Take 100 mg by mouth daily.      ? Omega-3 Fatty Acids (FISH OIL) 1000 MG CAPS Take 1,000 mg by mouth daily.      ? simvastatin (ZOCOR) 20 MG tablet Take 20 mg by mouth every morning.      ? SYMBICORT 80-4.5 MCG/ACT inhaler Inhale 2 puffs into the lungs 2 (two) times daily as needed (shortness of breath).      ?  ?         ?Current Facility-Administered Medications  ?Medication Dose Route Frequency Provider Last Rate Last Admin  ? sodium chloride flush (NS) 0.9 % injection 3 mL  3 mL Intravenous Q12H Lorretta Harp, MD      ?  ?  ?REVIEW OF SYSTEMS:  ?'[X]'$  denotes positive finding, '[ ]'$  denotes negative finding ?Cardiac   Comments:  ?Chest pain or chest pressure:      ?Shortness of breath upon exertion:      ?Short of breath when lying flat:      ?Irregular heart rhythm:      ?       ?Vascular      ?Pain in calf, thigh, or hip brought on by ambulation: x Left  ?Pain in feet at night that wakes you up from your sleep:       ?Blood clot in your veins:      ?Leg swelling:       ?       ?Pulmonary      ?Oxygen at home:      ?Productive cough:       ?Wheezing:       ?       ?Neurologic      ?Sudden weakness in arms or legs:       ?Sudden numbness in arms or legs:       ?Sudden onset of difficulty speaking or slurred speech:      ?Temporary loss of vision in one eye:       ?Problems with dizziness:       ?       ?Gastrointestinal      ?Blood  in stool:       ?Vomited blood:       ?       ?Genitourinary      ?Burning when urinating:       ?Blood in urine:      ?       ?Psychiatric      ?Major depression:       ?       ?Hematologic      ?Bleeding problems:      ?Problems with blood clotting too easily:      ?       ?Skin      ?Rashes or ulcers:      ?       ?Constitutional      ?Fever or chills:      ?  ?  ?PHYSICAL EXAM: ?   ?Vitals:  ?  07/12/21 1010  ?BP:  124/72  ?Pulse: 63  ?Resp: 18  ?Temp: 98.1 ?F (36.7 ?C)  ?TempSrc: Temporal  ?SpO2: (!) 61%  ?Weight: 167 lb (75.8 kg)  ?Height: '5\' 9"'$  (1.753 m)  ?  ?  ?GENERAL: The patient is a well-nourished male, in no acute distress. The vital signs are documented above. ?CARDIAC: There is a regular rate and rhythm.  ?VASCULAR:  ?Right femoral pulse easily palpable ?Right PT palpable ?Left femoral pulse weak ?No palpable left pedal pulse ?No lower extremity tissue loss ?PULMONARY: No respiratory distress. ?ABDOMEN: Soft and non-tender. ?MUSCULOSKELETAL: There are no major deformities or cyanosis. ?NEUROLOGIC: No focal weakness or paresthesias are detected. ?SKIN: There are no ulcers or rashes noted. ?PSYCHIATRIC: The patient has a normal affect. ?  ?DATA:  ?  ?ABI 06/06/21 were 0.93 right biphasic and 0.61 left monophasic.  ?  ?Arteriogram imaging reviewed from 06/27/2021 that shows occluded left common femoral artery with occlusion of the proximal profunda and SFA.  SFA reconstitutes and there is a distal mid SFA occlusion as well. ?  ?Assessment/Plan: ?  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  I have reviewed his images from Dr. Gwenlyn Found and agree he would benefit from left common femoral endarterectomy given the left common femoral occlusion as well as occlusion of the proximal profunda and SFA.  I discussed that this will likely require a longitudinal groin incision in order to get adequate exposure of the SFA for endarterectomy.  This will not address his more distal SFA occlusion.  I think with endarterectomy alone he should be much better from a symptom standpoint as it related to his claudication  Risk benefits discussed including risk of bleeding, infection, nonhealing wounds, risk of anesthesia etc.  We will get him scheduled for next Wednesday on 07/20/21. ?  ?  ?Alex Heck,  MD ?[facility] ?Office: [phone] ?

## 2021-07-20 NOTE — Anesthesia Procedure Notes (Signed)
Arterial Line Insertion ?Start/End5/17/2023 8:00 AM, 07/20/2021 8:05 AM ?Performed by: Murvin Natal, MD, Griffin Dakin, CRNA, CRNA ? Patient location: Pre-op. ?Preanesthetic checklist: patient identified, IV checked, site marked, risks and benefits discussed, surgical consent, monitors and equipment checked, pre-op evaluation, timeout performed and anesthesia consent ?Lidocaine 1% used for infiltration ?Right, radial was placed ?Catheter size: 20 G ?Hand hygiene performed  and maximum sterile barriers used  ? ?Attempts: 1 ?Procedure performed without using ultrasound guided technique. ?Following insertion, dressing applied and Biopatch. ?Post procedure assessment: normal and unchanged ? ?Patient tolerated the procedure well with no immediate complications. ? ? ?

## 2021-07-20 NOTE — Discharge Instructions (Signed)
 [facility]  Discharge instructions  Lower Extremity Bypass Surgery  Please refer to the following instruction for your post-procedure care. Your surgeon or physician assistant will discuss any changes with you.  Activity  You are encouraged to walk as much as you can. You can slowly return to normal activities during the month after your surgery. Avoid strenuous activity and heavy lifting until your doctor tells you it's OK. Avoid activities such as vacuuming or swinging a golf club. Do not drive until your doctor give the OK and you are no longer taking prescription pain medications. It is also normal to have difficulty with sleep habits, eating and bowel movement after surgery. These will go away with time.  Bathing/Showering  Shower daily after you go home. Do not soak in a bathtub, hot tub, or swim until the incision heals completely.  Incision Care  Clean your incision with mild soap and water. Shower every day. Pat the area dry with a clean towel. You do not need a bandage unless otherwise instructed. Do not apply any ointments or creams to your incision. If you have open wounds you will be instructed how to care for them or a visiting nurse may be arranged for you. If you have staples or sutures along your incision they will be removed at your post-op appointment. You may have skin glue on your incision. Do not peel it off. It will come off on its own in about one week.  Wash the groin wound with soap and water daily and pat dry. (No tub bath-only shower)  Then put a dry gauze or washcloth in the groin to keep this area dry to help prevent wound infection.  Do this daily and as needed.  Do not use Vaseline or neosporin on your incisions.  Only use soap and water on your incisions and then protect and keep dry.  Diet  Resume your normal diet. There are no special food restrictions following this procedure. A low fat/ low cholesterol diet is  recommended for all patients with vascular disease. In order to heal from your surgery, it is CRITICAL to get adequate nutrition. Your body requires vitamins, minerals, and protein. Vegetables are the best source of vitamins and minerals. Vegetables also provide the perfect balance of protein. Processed food has little nutritional value, so try to avoid this.  Medications  Resume taking all your medications unless your doctor or physician assistant tells you not to. If your incision is causing pain, you may take over-the-counter pain relievers such as acetaminophen (Tylenol). If you were prescribed a stronger pain medication, please aware these medication can cause nausea and constipation. Prevent nausea by taking the medication with a snack or meal. Avoid constipation by drinking plenty of fluids and eating foods with high amount of fiber, such as fruits, vegetables, and grains. Take Colace 100 mg (an over-the-counter stool softener) twice a day as needed for constipation.  Do not take Tylenol if you are taking prescription pain medications.  Follow Up  Our office will schedule a follow up appointment 2-3 weeks following discharge.  Please call us immediately for any of the following conditions  Severe or worsening pain in your legs or feet while at rest or while walking Increase pain, redness, warmth, or drainage (pus) from your incision site(s) Fever of 101 degree or higher The swelling in your leg with the bypass suddenly worsens and becomes more painful than when you were in the hospital If you have   been instructed to feel your graft pulse then you should do so every day. If you can no longer feel this pulse, call the office immediately. Not all patients are given this instruction.  Leg swelling is common after leg bypass surgery.  The swelling should improve over a few months following surgery. To improve the swelling, you may elevate your legs above the level of your heart while you are  sitting or resting. Your surgeon or physician assistant may ask you to apply an ACE wrap or wear compression (TED) stockings to help to reduce swelling.  Reduce your risk of vascular disease  Stop smoking. If you would like help call [facility] at 1-800-QUIT-NOW (1-800-784-8669) or [facility] at 336-586-4000.  Manage your cholesterol Maintain a desired weight Control your diabetes weight Control your diabetes Keep your blood pressure down  If you have any questions, please call the office at [phone]  

## 2021-07-20 NOTE — Op Note (Signed)
Date: Jul 20, 2021 ? 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 ?Postoperative diagnosis: Same ? ?Procedure: Left common femoral endarterectomy with profundoplasty with endarterectomy of proximal SFA and bovine pericardial patch angioplasty ? ?Surgeon: Dr. [name], MD ? ?Assistant: Leontine Locket, PA ? ?Indications: 81 year old male who has been under the care of Dr. Gwenlyn Found and recently evaluated for short distance lifestyle limiting claudication of the left lower extremity.  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  He presents today for left common femoral endarterectomy after risks benefits discussed.  An assistant was needed for exposure and to expedite the case and to sew the patch angioplasty. ? ?Findings: Longitudinal incision in the left groin in order to get distal exposure given the proximal SFA and profunda were occluded.  Ultimately endarterectomy was performed of the common femoral artery and I opened the arteriotomy onto the profunda for about 3 cm until I found a decent endpoint.  The SFA was endarterectomized with eversion technique and a long plug was removed where it was totally occluded.  Ultimately bovine pericardial patch was sewn from the left common femoral artery onto the profunda.  Palpable femoral pulse at completion.  Brisk Doppler signals in the foot. ? ?Anesthesia: General ? ?Details: Patient was taken to the operating room after informed consent was obtained.  Placed on the operative table in supine position.  General endotracheal anesthesia was induced.  The left groin was then prepped and draped in standard sterile fashion.  Timeout was performed.  Antibiotics were given.  Initially made a longitudinal incision over the left common femoral artery where the femoral pulse was felt more proximally.   Dissected down and the subcutaneous tissue was opened with Bovie cautery and then I opened the femoral sheath longitudinally.  Cerebellar retractors were used for added visualization.  I dissected out the common femoral as well as the SFA and profunda and these were controlled with vessel loops.  I dissected under the inguinal ligament and the distal external iliac artery was controlled with Vesseloops.  Patient was given 100 units/kg IV heparin.  ACT was checked to confirm it was greater than 250.  I then used Vesseloops on SFA and profunda as well as a Henley clamp on the distal external iliac artery.  The left common femoral artery was opened with 11 blade scalpel and arteriotomy was extended with Potts scissors and I took the arteriotomy onto the profunda given there was not a good endpoint proximally in the vessel.  Endarterectomy was then performed with a Garment/textile technologist of the common femoral up into the distal external iliac with excellent inflow.  I then carried the endarterectomy down onto the profunda and had opened this arteriotomy about 3 cm onto the profunda to get to a good endpoint where the plaque ended.  I then did an eversion endarterectomy of the SFA and retrieved a long plug where the artery was occluded.  Ultimately a long bovine pericardial patch was brought on the field and sewn from the profunda onto the common femoral artery using a 5-0 Prolene parachute technique with the help of my assistant.  We had good backbleeding from the profunda.  We did de-air the artery prior to completion.  Ultimately once we came off clamps we had triphasic profunda signal and a occlusive SFA signal given he had a more distal SFA occlusion on  his arteriogram.  There were excellent doppler signals in the left foot.  Patient was given protamine for reversal.  Surgicel snow was used for hemostasis.  The left groin was irrigated and closed in multiple layers of 2-0 Vicryl, 3-0 Vicryl, 4-0 Monocryl and Dermabond.   Taken to recovery in stable condition. ? ?Complication: None ? ?Condition: Stable ? ?[name], MD ?[facility] ?Office: [phone] ? ? ?[name] ? ?

## 2021-07-20 NOTE — Anesthesia Preprocedure Evaluation (Addendum)
Anesthesia Evaluation  ?Patient identified by MRN, date of birth, ID band ?Patient awake ? ? ? ?Reviewed: ?Allergy & Precautions, NPO status , Patient's Chart, lab work & pertinent test results ? ?Airway ?Mallampati: II ? ?TM Distance: >3 FB ?Neck ROM: Full ? ? ? Dental ? ?(+) Upper Dentures, Lower Dentures ?  ?Pulmonary ?former smoker,  ?  ?Pulmonary exam normal ? ? ? ? ? ? ? Cardiovascular ?hypertension, Pt. on medications ?+ Peripheral Vascular Disease  ?Normal cardiovascular exam ? ?ECG: NSR, rate 61 ?  ?Neuro/Psych ?negative neurological ROS ? negative psych ROS  ? GI/Hepatic ?Neg liver ROS, GERD  Controlled,  ?Endo/Other  ?negative endocrine ROS ? Renal/GU ?negative Renal ROS  ? ?  ?Musculoskeletal ? ?(+) Arthritis ,  ? Abdominal ?  ?Peds ? Hematology ? ?(+) Blood dyscrasia, anemia ,   ?Anesthesia Other Findings ?PAD ? Reproductive/Obstetrics ? ?  ? ? ? ? ? ? ? ? ? ? ? ? ? ?  ?  ? ? ? ? ? ? ? ?Anesthesia Physical ?Anesthesia Plan ? ?ASA: 3 ? ?Anesthesia Plan: General  ? ?Post-op Pain Management:   ? ?Induction: Intravenous ? ?PONV Risk Score and Plan: 2 and Ondansetron, Dexamethasone and Treatment may vary due to age or medical condition ? ?Airway Management Planned: Oral ETT ? ?Additional Equipment: Arterial line ? ?Intra-op Plan:  ? ?Post-operative Plan: Extubation in OR ? ?Informed Consent: I have reviewed the patients History and Physical, chart, labs and discussed the procedure including the risks, benefits and alternatives for the proposed anesthesia with the patient or authorized representative who has indicated his/her understanding and acceptance.  ? ? ? ?Dental advisory given ? ?Plan Discussed with: CRNA ? ?Anesthesia Plan Comments:   ? ? ? ? ? ?Anesthesia Quick Evaluation ? ?

## 2021-07-20 NOTE — Anesthesia Procedure Notes (Signed)
Procedure Name: Intubation ?Date/Time: 07/20/2021 8:36 AM ?Performed by: Griffin Dakin, CRNA ?Pre-anesthesia Checklist: Patient identified, Emergency Drugs available, Suction available and Patient being monitored ?Patient Re-evaluated:Patient Re-evaluated prior to induction ?Oxygen Delivery Method: Circle system utilized ?Preoxygenation: Pre-oxygenation with 100% oxygen ?Induction Type: IV induction ?Ventilation: Mask ventilation without difficulty and Oral airway inserted - appropriate to patient size ?Laryngoscope Size: Mac and 4 ?Grade View: Grade I ?Tube type: Oral ?Tube size: 7.5 mm ?Number of attempts: 1 ?Airway Equipment and Method: Stylet ?Placement Confirmation: ETT inserted through vocal cords under direct vision, positive ETCO2 and breath sounds checked- equal and bilateral ?Secured at: 24 cm ?Tube secured with: Tape ?Dental Injury: Teeth and Oropharynx as per pre-operative assessment  ? ? ? ? ?

## 2021-07-21 ENCOUNTER — Other Ambulatory Visit (HOSPITAL_COMMUNITY): Payer: Self-pay

## 2021-07-21 LAB — BASIC METABOLIC PANEL
Anion gap: 5 (ref 5–15)
BUN: 16 mg/dL (ref 8–23)
CO2: 22 mmol/L (ref 22–32)
Calcium: 8.5 mg/dL — ABNORMAL LOW (ref 8.9–10.3)
Chloride: 110 mmol/L (ref 98–111)
Creatinine, Ser: 1.45 mg/dL — ABNORMAL HIGH (ref 0.61–1.24)
GFR, Estimated: 49 mL/min — ABNORMAL LOW (ref >60)
Glucose, Bld: 164 mg/dL — ABNORMAL HIGH (ref 70–99)
Potassium: 3.9 mmol/L (ref 3.5–5.1)
Sodium: 137 mmol/L (ref 135–145)

## 2021-07-21 LAB — LIPID PANEL
Cholesterol: 107 mg/dL (ref 0–200)
HDL: 52 mg/dL (ref >40)
LDL Cholesterol: 48 mg/dL (ref 0–99)
Total CHOL/HDL Ratio: 2.1 RATIO
Triglycerides: 33 mg/dL (ref <150)
VLDL: 7 mg/dL (ref 0–40)

## 2021-07-21 LAB — CBC
HCT: 33.8 % — ABNORMAL LOW (ref 39.0–52.0)
Hemoglobin: 11.3 g/dL — ABNORMAL LOW (ref 13.0–17.0)
MCH: 31.7 pg (ref 26.0–34.0)
MCHC: 33.4 g/dL (ref 30.0–36.0)
MCV: 94.9 fL (ref 80.0–100.0)
Platelets: 208 10*3/uL (ref 150–400)
RBC: 3.56 MIL/uL — ABNORMAL LOW (ref 4.22–5.81)
RDW: 13.1 % (ref 11.5–15.5)
WBC: 10.3 10*3/uL (ref 4.0–10.5)
nRBC: 0 % (ref 0.0–0.2)

## 2021-07-21 MED ORDER — OXYCODONE-ACETAMINOPHEN 5-325 MG PO TABS
1.0000 | ORAL_TABLET | Freq: Four times a day (QID) | ORAL | 0 refills | Status: DC | PRN
  Filled 2021-07-21: qty 20, 5d supply, fill #0

## 2021-07-21 NOTE — Evaluation (Signed)
Physical Therapy Evaluation & Discharge Patient Details Name: Alex Franklin MRN: [id number] DOB: Apr 17, 1940 Today's Date: 07/21/2021  History of Present Illness  Pt is an 81 y.o. male admitted 07/20/21 for L femoral endarterectomy with bovine patch angioplasty. PMH includes HTN, arthritis, prostate CA.   Clinical Impression  Patient evaluated by Physical Therapy with no further acute PT needs identified. PTA, pt independent, drives, active and lives with wife. Today, pt indep with mobility and ADL tasks. All education has been completed and the patient has no further questions. Acute PT is signing off. Thank you for this referral.   Recommendations for follow up therapy are one component of a multi-disciplinary discharge planning process, led by the attending physician.  Recommendations may be updated based on patient status, additional functional criteria and insurance authorization.  Follow Up Recommendations No PT follow up    Assistance Recommended at Discharge PRN  Patient can return home with the following  Assistance with cooking/housework;Assist for transportation    Equipment Recommendations None recommended by PT  Recommendations for Other Services   N/A    Functional Status Assessment  N/A     Precautions / Restrictions Precautions Precautions: None Restrictions Weight Bearing Restrictions: No      Mobility  Bed Mobility Overal bed mobility: Independent                  Transfers Overall transfer level: Independent Equipment used: None                    Ambulation/Gait Ambulation/Gait assistance: Independent Gait Distance (Feet): 600 Feet Assistive device: None Gait Pattern/deviations: Step-through pattern, Decreased stride length, Antalgic Gait velocity: Decreased     General Gait Details: slow, steady ambulation without DME, indep; reports LLE stiffness improving with ambulation distance  Stairs Stairs: Yes Stairs assistance:  Modified independent (Device/Increase time) Stair Management: One rail Left, Alternating pattern, Step to pattern, Forwards Number of Stairs: 4 General stair comments: ascend alternating pattern, descend with step-to; educ on technique to decrease LLE pain; mod indep with rail support  Wheelchair Mobility    Modified Rankin (Stroke Patients Only)       Balance Overall balance assessment: Modified Independent   Sitting balance-Leahy Scale: Good       Standing balance-Leahy Scale: Good                               Pertinent Vitals/Pain Pain Assessment Pain Assessment: Faces Faces Pain Scale: Hurts a little bit Pain Location: L thigh/groin incision Pain Descriptors / Indicators: Tightness Pain Intervention(s): Monitored during session    Home Living Family/patient expects to be discharged to:: Private residence Living Arrangements: Spouse/significant other Available Help at Discharge: Family;Available 24 hours/day Type of Home: House Home Access: Stairs to enter Entrance Stairs-Rails: Psychiatric nurse of Steps: 2   Home Layout: One level Home Equipment: Grab bars - tub/shower      Prior Function Prior Level of Function : Independent/Modified Independent;Driving             Mobility Comments: Indep without DME; push mowing ~.75 acre yard; enjoys Programmer, multimedia        Extremity/Trunk Assessment                Communication   Communication: No difficulties  Cognition Arousal/Alertness: Awake/alert Behavior During Therapy: WFL for tasks assessed/performed Overall Cognitive Status: Within Functional Limits for  tasks assessed                                          General Comments General comments (skin integrity, edema, etc.): educ re: precautions, positioning, edema control, DVT prevention, activity recommendations (including LLE AROM)    Exercises     Assessment/Plan    PT  Assessment Patient does not need any further PT services  PT Problem List         PT Treatment Interventions      PT Goals (Current goals can be found in the Care Plan section)  Acute Rehab PT Goals PT Goal Formulation: All assessment and education complete, DC therapy    Frequency       Co-evaluation               AM-PAC PT "6 Clicks" Mobility  Outcome Measure Help needed turning from your back to your side while in a flat bed without using bedrails?: None Help needed moving from lying on your back to sitting on the side of a flat bed without using bedrails?: None Help needed moving to and from a bed to a chair (including a wheelchair)?: None Help needed standing up from a chair using your arms (e.g., wheelchair or bedside chair)?: None Help needed to walk in hospital room?: None Help needed climbing 3-5 steps with a railing? : None 6 Click Score: 24    End of Session Equipment Utilized During Treatment: Gait belt Activity Tolerance: Patient tolerated treatment well Patient left: in chair;with call bell/phone within reach Nurse Communication: Mobility status PT Visit Diagnosis: Other abnormalities of gait and mobility (R26.89);Pain    Time: 9937-1696 PT Time Calculation (min) (ACUTE ONLY): 14 min   Charges:   PT Evaluation $PT Eval Low Complexity: [place], PT, DPT Acute Rehabilitation Services  Pager [phone] Office [place] 07/21/2021, 9:07 AM

## 2021-07-21 NOTE — Progress Notes (Signed)
Occupational Therapy Treatment Patient Details Name: Alex Franklin MRN: [id number] DOB: [birth year] Today's Date: 07/21/2021      OT comments  Screen only. Pt. Does not have skilled OT needs. Pt. Is able to perform ADLs and mobility without assist.            Reece Packer OT/L   Alex Franklin 07/21/2021, 9:03 AM

## 2021-07-21 NOTE — Progress Notes (Addendum)
[facility] [place]  Subjective  - Doing well with good pain control   Objective 111/63 61 98.1 F (36.7 C) (Oral) 16 95%  Intake/Output Summary (Last 24 hours) at 07/21/2021 0743 Last data filed at 07/21/2021 0500 Gross per 24 hour  Intake 1133.93 ml  Output 1375 ml  Net -241.07 ml    Left groin soft without hematoma Doppler signals PT/DP intact Lungs non labored breathing  Assessment/Planning: POD # 1  81 y.o. 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 F/U will be arranged in 2-3 weeks for incision check followed by later appointment for baseline ABI's. Cont. ASA and Statin daily,   Roxy Horseman 07/21/2021 7:43 AM --  Laboratory Lab Results: Recent Labs    07/20/21 1439 07/21/21 0325  WBC 8.0 10.3  HGB 12.3* 11.3*  HCT 37.0* 33.8*  PLT 247 208   BMET Recent Labs    07/18/21 1519 07/20/21 1439 07/21/21 0325  NA 136  --  137  K 3.9  --  3.9  CL 105  --  110  CO2 24  --  22  GLUCOSE 95  --  164*  BUN 17  --  16  CREATININE 1.36* 1.32* 1.45*  CALCIUM 9.0  --  8.5*    COAG Lab Results  Component Value Date   INR 1.0 07/18/2021   No results found for: PTT  I have seen and evaluated the patient. I agree with the PA note as documented above.  Postoperative day 1 status post left common femoral endarterectomy including profundoplasty and endarterectomy of the proximal SFA.  He has a palpable femoral pulse this morning on the left.  His incision looks great.  Brisk posterior tibial and dorsalis pedis Doppler signals in the left foot.  He has ambulated including going on a few stairs.  Pain is well controlled.  Plan discharge today.  I will arrange follow-up in 2 to 3 weeks for incision checks and then he will follow-up with Dr.  Gwenlyn Found.  He does have a more distal SFA occlusion and may ultimately require stenting of this if his symptoms are not completely resolved as I again discussed with him.  Discussed aspirin statin at discharge.  [name], MD [facility] [place] Office: [phone]

## 2021-07-21 NOTE — Progress Notes (Signed)
07/21/2021 10:48 AM Discharge AVS meds taken today and those due this evening reviewed.  Follow-up appointments and when to call md reviewed.  D/C IV and TELE.  Questions and concerns addressed.   D/C home per orders.  Carney [place]ers

## 2021-07-21 NOTE — Anesthesia Postprocedure Evaluation (Signed)
Anesthesia Post Note  Patient: Alex Franklin  Procedure(s) Performed: LEFT COMMON FEMORAL ENDARTERECTOMY (Left: Groin) PATCH PROFUNDAPLASTY WITH 1cm x 14cm XENOSURE BIOLOGIC PATCH (Left: Groin)     Patient location during evaluation: PACU Anesthesia Type: General Level of consciousness: awake Pain management: pain level controlled Vital Signs Assessment: post-procedure vital signs reviewed and stable Respiratory status: spontaneous breathing, nonlabored ventilation, respiratory function stable and patient connected to nasal cannula oxygen Cardiovascular status: blood pressure returned to baseline and stable Postop Assessment: no apparent nausea or vomiting Anesthetic complications: no   No notable events documented.  Last Vitals:  Vitals:   07/21/21 0826 07/21/21 0942  BP: (!) 123/59 131/60  Pulse: 62   Resp: 15   Temp: 36.6 C   SpO2: 97%     Last Pain:  Vitals:   07/21/21 0945  TempSrc:   PainSc: 8                  Lenor Provencher P July Nickson

## 2021-07-22 ENCOUNTER — Encounter (HOSPITAL_COMMUNITY): Payer: Self-pay | Admitting: Vascular Surgery

## 2021-07-25 NOTE — Discharge Summary (Signed)
[facility] Discharge Summary   Patient ID:  Alex Franklin MRN: [id number] DOB/AGE: 1940-12-17 81 y.o.  Admit date: 07/20/2021 Discharge date: 07/21/21 Date of Surgery: 07/20/2021 Surgeon: Surgeon(s): [name], MD  Admission Diagnosis: PAD (peripheral artery disease) [facility]) [I73.9]  Discharge Diagnoses:  PAD (peripheral artery disease) ([place]) [I73.9]  Secondary Diagnoses: Past Medical History:  Diagnosis Date   Arthritis    GERD (gastroesophageal reflux disease)    Hypertension    Prostate cancer ([place]) 10/19/2020    Procedure(s): LEFT COMMON FEMORAL ENDARTERECTOMY PATCH PROFUNDAPLASTY WITH 1cm x 14cm XENOSURE BIOLOGIC PATCH  Discharged Condition: good  HPI: Alex Franklin is a 81 y.o. male with left LE life style limiting claudication.   Angiogram on 06/27/2021 showing the left common femoral artery as well as the proximal SFA and proximal profunda occluded.  He was scheduled for intervention.   Hospital Course:  Alex Franklin is a 81 y.o. male is S/P  Procedure(s): LEFT COMMON FEMORAL ENDARTERECTOMY PATCH PROFUNDAPLASTY WITH 1cm x 14cm [place] He had an uneventful stay over night Left groin healing well without hematoma, good mobility, PO intake without N/V.  Discharged in stable condition POD # !.  Cont. ASA and Statin daily.   Significant Diagnostic Studies: CBC Lab Results  Component Value Date   WBC 10.3 07/21/2021   HGB 11.3 (L) 07/21/2021   HCT 33.8 (L) 07/21/2021   MCV 94.9 07/21/2021   PLT 208 07/21/2021    BMET    Component Value Date/Time   NA 137 07/21/2021 0325   NA 138 06/24/2021 1507   K 3.9 07/21/2021 0325   CL 110 07/21/2021 0325   CO2 22 07/21/2021 0325   GLUCOSE 164 (H) 07/21/2021 0325   BUN 16 07/21/2021 0325   BUN 19 06/24/2021 1507   CREATININE 1.45 (H) 07/21/2021 0325   CALCIUM 8.5 (L) 07/21/2021 0325   GFRNONAA 49 (L) 07/21/2021 0325   COAG Lab Results  Component Value Date    INR 1.0 07/18/2021     Disposition:  Discharge to :Home Discharge Instructions     Call MD for:  redness, tenderness, or signs of infection (pain, swelling, bleeding, redness, odor or green/yellow discharge around incision site)   Complete by: As directed    Call MD for:  severe or increased pain, loss or decreased feeling  in affected limb(s)   Complete by: As directed    Call MD for:  temperature >100.5   Complete by: As directed    Resume previous diet   Complete by: As directed       Allergies as of 07/21/2021   No Known Allergies      Medication List     TAKE these medications    acetaminophen 500 MG tablet Commonly known as: TYLENOL Take 1,000 mg by mouth daily as needed for moderate pain.   amLODipine 10 MG tablet Commonly known as: NORVASC Take 10 mg by mouth every morning.   aspirin EC 81 MG tablet Take 81 mg by mouth every morning.   cyanocobalamin 2000 MCG tablet Take 2,000 mcg by mouth every Monday, Wednesday, and Friday.   Dialyvite Vitamin D 5000 125 MCG (5000 UT) capsule Generic drug: Cholecalciferol Take 5,000 Units by mouth daily.   doxazosin 4 MG tablet Commonly known as: CARDURA Take 4 mg by mouth at bedtime.   Fish Oil 1000 MG Caps Take 1,000 mg by mouth daily.   ibuprofen 200 MG tablet Commonly known as: ADVIL  Take 600 mg by mouth daily as needed for moderate pain.   losartan 100 MG tablet Commonly known as: COZAAR Take 100 mg by mouth daily.   oxyCODONE-acetaminophen 5-325 MG tablet Commonly known as: PERCOCET/ROXICET Take 1 tablet by mouth every 6 (six) hours as needed for moderate pain.   simvastatin 20 MG tablet Commonly known as: ZOCOR Take 20 mg by mouth every morning.   Symbicort 80-4.5 MCG/ACT inhaler Generic drug: budesonide-formoterol Inhale 2 puffs into the lungs 2 (two) times daily as needed (shortness of breath).       Verbal and written Discharge instructions given to the patient. Wound care per  Discharge AVS  Follow-up Information     [facility] Follow up in 3 week(s).   Specialty: Vascular Surgery Why: Office will call you to arrange your appt (sent) Contact information: [address] [phone]                Signed: Roxy Horseman 07/25/2021, 8:13 AM

## 2021-07-26 ENCOUNTER — Other Ambulatory Visit: Payer: Self-pay

## 2021-07-27 DIAGNOSIS — E7800 Pure hypercholesterolemia, unspecified: Secondary | ICD-10-CM | POA: Diagnosis not present

## 2021-07-27 DIAGNOSIS — I10 Essential (primary) hypertension: Secondary | ICD-10-CM | POA: Diagnosis not present

## 2021-07-27 DIAGNOSIS — J440 Chronic obstructive pulmonary disease with acute lower respiratory infection: Secondary | ICD-10-CM | POA: Diagnosis not present

## 2021-08-03 NOTE — Progress Notes (Signed)
POST OPERATIVE OFFICE NOTE    CC:  F/u for surgery  HPI:  This is a 81 y.o. male who is s/p Left common femoral endarterectomy with profundoplasty with endarterectomy of proximal SFA and bovine pericardial patch angioplasty 07/20/21 by Dr. Carlis Abbott. This was performed for lifestyle limiting claudication. Since his intervention he says his left leg feels great. He does still get some tightness in his left calf on ambulation up inclines. He says it is still a litter weaker than his right leg but is much better than prior to surgery. He has had some intermittent sharp shooting pains in left groin. This lasts only several seconds and resolves. Usually it is positional. He has additionally noticed a bulge along left groin incision. Denies any redness, tenderness, fever or chills. He is compliant with his Aspirin and statin. He explains that he has follow up with Dr. Gwenlyn Found next week on 6/13 for arterial duplex and then office visit on 6/20 to review the results.   No Known Allergies  Current Outpatient Medications  Medication Sig Dispense Refill   acetaminophen (TYLENOL) 500 MG tablet Take 1,000 mg by mouth daily as needed for moderate pain.     amLODipine (NORVASC) 10 MG tablet Take 10 mg by mouth every morning.     aspirin EC 81 MG tablet Take 81 mg by mouth every morning.     Cholecalciferol (DIALYVITE VITAMIN D 5000) 125 MCG (5000 UT) capsule Take 5,000 Units by mouth daily.     doxazosin (CARDURA) 4 MG tablet Take 4 mg by mouth at bedtime.     ibuprofen (ADVIL) 200 MG tablet Take 600 mg by mouth daily as needed for moderate pain.     losartan (COZAAR) 100 MG tablet Take 100 mg by mouth daily.     Omega-3 Fatty Acids (FISH OIL) 1000 MG CAPS Take 1,000 mg by mouth daily.     simvastatin (ZOCOR) 20 MG tablet Take 20 mg by mouth every morning.     SYMBICORT 80-4.5 MCG/ACT inhaler Inhale 2 puffs into the lungs 2 (two) times daily as needed (shortness of breath).     cyanocobalamin 2000 MCG tablet Take  2,000 mcg by mouth every Monday, Wednesday, and Friday.     oxyCODONE-acetaminophen (PERCOCET/ROXICET) 5-325 MG tablet Take 1 tablet by mouth every 6 (six) hours as needed for moderate pain. 20 tablet 0   Current Facility-Administered Medications  Medication Dose Route Frequency Provider Last Rate Last Admin   sodium chloride flush (NS) 0.9 % injection 3 mL  3 mL Intravenous Q12H Lorretta Harp, MD         ROS:  See HPI  Physical Exam:  Vitals:   08/09/21 0936  BP: 117/61  Pulse: 62  Resp: 18  Temp: 98 F (36.7 C)  TempSrc: Temporal  SpO2: 97%  Weight: 170 lb 4.8 oz (77.2 kg)  Height: '5\' 9"'$  (1.753 m)    General:well appearing, pleasant, in no distress Cardiac:regular Lungs: non labored Incision:  left groin incision is clean, dry and intact. Healing well. He does have soft, non tender area of fullness likely lymphocele. No erythema Extremities:  well perfused and warm. No palpable distal pulses. Motor and sensation intact Neuro: alert and oriented Abdomen:  soft  Assessment/Plan:  This is a 81 y.o. male who is s/p: Left common femoral endarterectomy with profundoplasty with endarterectomy of proximal SFA and bovine pericardial patch angioplasty 07/20/21 by Dr. Carlis Abbott.  Incision is intact and healing well. Left leg well perfused and  warm.  - He does have likely lymphocele or  in left groin. Explained that this will resolve with time. No evidence of infection. Advised patient to observe it for any redness, increase in size, pain or warmth - continue Asprin and statin  - He follows up with Dr. Gwenlyn Found next week for duplex on 6/13 and office visit on 6/20. [place] - He can follow up with [place] as needed if he has new or concerning symptoms   Karoline Caldwell, PA-C [facility] [phone]  Clinic MD:  Carlis Abbott

## 2021-08-09 ENCOUNTER — Ambulatory Visit (INDEPENDENT_AMBULATORY_CARE_PROVIDER_SITE_OTHER): Payer: PPO | Admitting: Physician Assistant

## 2021-08-09 VITALS — BP 117/61 | HR 62 | Temp 98.0°F | Resp 18 | Ht 69.0 in | Wt 170.3 lb

## 2021-08-09 DIAGNOSIS — I739 Peripheral vascular disease, unspecified: Secondary | ICD-10-CM

## 2021-08-16 ENCOUNTER — Ambulatory Visit (HOSPITAL_COMMUNITY)
Admission: RE | Admit: 2021-08-16 | Discharge: 2021-08-16 | Disposition: A | Discharge status: Home / Self Care | Payer: PPO | Source: Ambulatory Visit | Attending: Cardiovascular Disease | Admitting: Cardiovascular Disease

## 2021-08-16 DIAGNOSIS — I739 Peripheral vascular disease, unspecified: Secondary | ICD-10-CM | POA: Diagnosis not present

## 2021-08-23 ENCOUNTER — Ambulatory Visit: Payer: PPO | Admitting: Cardiovascular Disease

## 2021-08-23 ENCOUNTER — Encounter: Payer: Self-pay | Admitting: Cardiovascular Disease

## 2021-08-23 VITALS — BP 130/68 | HR 58 | Ht 69.0 in | Wt 170.2 lb

## 2021-08-23 DIAGNOSIS — E782 Mixed hyperlipidemia: Secondary | ICD-10-CM | POA: Diagnosis not present

## 2021-08-23 DIAGNOSIS — E785 Hyperlipidemia, unspecified: Secondary | ICD-10-CM | POA: Insufficient documentation

## 2021-08-23 DIAGNOSIS — I739 Peripheral vascular disease, unspecified: Secondary | ICD-10-CM

## 2021-08-23 DIAGNOSIS — I10 Essential (primary) hypertension: Secondary | ICD-10-CM | POA: Diagnosis not present

## 2021-08-23 NOTE — Assessment & Plan Note (Signed)
History of hyperlipidemia on statin therapy with lipid profile performed 07/21/2021 revealing total cholesterol 1 7, LDL 48 and HDL of 52.

## 2021-08-23 NOTE — Assessment & Plan Note (Signed)
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  I performed peripheral angiography on him 06/24/2019 revealing a 75% mid focal right SFA stenosis with three-vessel runoff and an occluded left common femoral artery extending into the proximal SFA and profunda with a small mid left SFA CTO two-vessel runoff.  I referred him to Dr. Fortunato Curling who performed left common femoral endarterectomy with patch angioplasty 07/20/2021.  His follow-up Doppler study performed 08/16/2021 revealed an increase in his left ABI from 0.61-0.68.  His claudication has essentially resolved.  At this point, my plan is to follow him noninvasively.  There is no indication for left SFA intervention at current time.

## 2021-08-23 NOTE — Progress Notes (Signed)
08/23/2021 Alex Franklin   1941-01-02  [id number]  Primary Physician Wenda Low, MD Primary Cardiologist: Lorretta Harp MD [name], [place]  HPI:  Alex Franklin is a 81 y.o. 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  His wife [name] is a patient of mine as well.  He was referred to me for PAD by his primary cardiologist Dr.Chandrasekhar for lifestyle-limiting claudication.  I last saw him in the office 07/08/2021.  His risk factors include discontinue tobacco abuse having stopped 7 years ago and smoked 30 to 50 pack years.  History of hypertension and hyperlipidemia.  His mother had CABG at brother has CAD as well.  He is never had a heart attack or stroke.  He is fairly active and walks and plays golf.  Does complain of left lower extremity claudication.  He had Doppler studies performed in our office 06/06/2021 revealing a right ABI of 0.93 and a left of 0.61 with a moderate lesion in his mid right SFA and a high-grade lesion in his distal left common femoral artery.  I performed peripheral angiography on him 06/27/2021 revealing an occluded distal left common femoral artery which was calcified extending into the origin of the profunda and proximal left SFA as well as a short segment mid left SFA CTO with two-vessel runoff.  I believe he would be best served by surgical revascularization of this, involving a common femoral endarterectomy with patch angioplasty as well as profundoplasty.  I referred him to Dr. Fortunato Curling for surgical evaluation.  He underwent left common femoral endarterectomy with patch angioplasty by Dr. Carlis Abbott 07/20/2021 he was discharged home the following day.  His Dopplers performed 08/16/2021 revealed an increase in his left ABI from 0.61-0.88.  His claudication has resolved.   Current Meds  Medication Sig   acetaminophen (TYLENOL) 500 MG tablet Take 1,000 mg by mouth  daily as needed for moderate pain.   amLODipine (NORVASC) 10 MG tablet Take 10 mg by mouth every morning.   aspirin EC 81 MG tablet Take 81 mg by mouth every morning.   Cholecalciferol (DIALYVITE VITAMIN D 5000) 125 MCG (5000 UT) capsule Take 5,000 Units by mouth daily.   doxazosin (CARDURA) 4 MG tablet Take 4 mg by mouth at bedtime.   ibuprofen (ADVIL) 200 MG tablet Take 600 mg by mouth daily as needed for moderate pain.   losartan (COZAAR) 100 MG tablet Take 100 mg by mouth daily.   Omega-3 Fatty Acids (FISH OIL) 1000 MG CAPS Take 1,000 mg by mouth daily.   simvastatin (ZOCOR) 20 MG tablet Take 20 mg by mouth every morning.   SYMBICORT 80-4.5 MCG/ACT inhaler Inhale 2 puffs into the lungs 2 (two) times daily as needed (shortness of breath).   Current Facility-Administered Medications for the 08/23/21 encounter (Office Visit) with Lorretta Harp, MD  Medication   sodium chloride flush (NS) 0.9 % injection 3 mL     No Known Allergies  Social History   Socioeconomic History   Marital status: Married    Spouse name: Not on file   Number of children: Not on file   Years of education: Not on file   Highest education level: Not on file  Occupational History   Not on file  Tobacco Use   Smoking status: Former    Types: Cigarettes    Quit date: 07/13/2013    Years since quitting:  8.1   Smokeless tobacco: Not on file  Vaping Use   Vaping Use: Never used  Substance and Sexual Activity   Alcohol use: Yes    Comment: occasionally- every couple of months   Drug use: No   Sexual activity: Not on file  Other Topics Concern   Not on file  Social History Narrative   Not on file   Social Determinants of Health   Financial Resource Strain: Not on file  Food Insecurity: Not on file  Transportation Needs: Not on file  Physical Activity: Not on file  Stress: Not on file  Social Connections: Not on file  Intimate Partner Violence: Not on file     Review of Systems: General:  negative for chills, fever, night sweats or weight changes.  Cardiovascular: negative for chest pain, dyspnea on exertion, edema, orthopnea, palpitations, paroxysmal nocturnal dyspnea or shortness of breath Dermatological: negative for rash Respiratory: negative for cough or wheezing Urologic: negative for hematuria Abdominal: negative for nausea, vomiting, diarrhea, bright red blood per rectum, melena, or hematemesis Neurologic: negative for visual changes, syncope, or dizziness All other systems reviewed and are otherwise negative except as noted above.    Blood pressure 130/68, pulse (!) 58, height '5\' 9"'$  (1.753 m), weight 170 lb 3.2 oz (77.2 kg), SpO2 95 %.  General appearance: alert and no distress Neck: no adenopathy, no carotid bruit, no JVD, supple, symmetrical, trachea midline, and thyroid not enlarged, symmetric, no tenderness/mass/nodules Lungs: clear to auscultation bilaterally Heart: regular rate and rhythm, S1, S2 normal, no murmur, click, rub or gallop Extremities: extremities normal, atraumatic, no cyanosis or edema Pulses: Diminished left pedal pulse Skin: Skin color, texture, turgor normal. No rashes or lesions Neurologic: Grossly normal  EKG not performed today  ASSESSMENT AND PLAN:   Hyperlipidemia History of hyperlipidemia on statin therapy with lipid profile performed 07/21/2021 revealing total cholesterol 1 7, LDL 48 and HDL of 52.  Essential hypertension History of essential hypertension blood pressure measured today at 130/68.  He is on amlodipine and losartan.  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  I performed peripheral angiography on him 06/24/2019 revealing a 75% mid focal right SFA stenosis with three-vessel runoff and an occluded left common femoral artery extending into the proximal SFA and profunda with a small mid left SFA CTO two-vessel runoff.  I referred him to Dr. Fortunato Curling  who performed left common femoral endarterectomy with patch angioplasty 07/20/2021.  His follow-up Doppler study performed 08/16/2021 revealed an increase in his left ABI from 0.61-0.68.  His claudication has essentially resolved.  At this point, my plan is to follow him noninvasively.  There is no indication for left SFA intervention at current time.     Lorretta Harp MD FACP,FACC,FAHA, [facility] 08/23/2021 3:44 PM

## 2021-08-23 NOTE — Patient Instructions (Signed)
Medication Instructions:  Your physician recommends that you continue on your current medications as directed. Please refer to the Current Medication list given to you today.  *If you need a refill on your cardiac medications before your next appointment, please call your pharmacy*    Testing/Procedures: Your physician has requested that you have a lower extremity arterial duplex. This test is an ultrasound of the arteries in the legs. It looks at arterial blood flow in the legs. Allow one hour for Lower  Arterial scans. There are no restrictions or special instructions  Your physician has requested that you have an ankle brachial index (ABI). During this test an ultrasound and blood pressure cuff are used to evaluate the arteries that supply the arms and legs with blood. Allow thirty minutes for this exam. There are no restrictions or special instructions. To be done in December. These procedures are done at [place]   Follow-Up: At [facility], you and your health needs are our priority.  As part of our continuing mission to provide you with exceptional heart care, we have created designated Provider Care Teams.  These Care Teams include your primary Cardiologist (physician) and Advanced Practice Providers (APPs -  Physician Assistants and Nurse Practitioners) who all work together to provide you with the care you need, when you need it.  We recommend signing up for the patient portal called "MyChart".  Sign up information is provided on this After Visit Summary.  MyChart is used to connect with patients for Virtual Visits (Telemedicine).  Patients are able to view lab/test results, encounter notes, upcoming appointments, etc.  Non-urgent messages can be sent to your provider as well.   To learn more about what you can do with MyChart, go to [email or web address].    Your next appointment:   6 month(s)  The format for your next appointment:   In Person  Provider:    [name], MD

## 2021-08-23 NOTE — Assessment & Plan Note (Signed)
History of essential hypertension blood pressure measured today at 130/68.  He is on amlodipine and losartan.

## 2021-08-24 DIAGNOSIS — M47816 Spondylosis without myelopathy or radiculopathy, lumbar region: Secondary | ICD-10-CM | POA: Diagnosis not present

## 2022-01-09 DIAGNOSIS — I10 Essential (primary) hypertension: Secondary | ICD-10-CM | POA: Diagnosis not present

## 2022-01-09 DIAGNOSIS — R0989 Other specified symptoms and signs involving the circulatory and respiratory systems: Secondary | ICD-10-CM | POA: Diagnosis not present

## 2022-01-09 DIAGNOSIS — C61 Malignant neoplasm of prostate: Secondary | ICD-10-CM | POA: Diagnosis not present

## 2022-01-09 DIAGNOSIS — J440 Chronic obstructive pulmonary disease with acute lower respiratory infection: Secondary | ICD-10-CM | POA: Diagnosis not present

## 2022-01-09 DIAGNOSIS — E7800 Pure hypercholesterolemia, unspecified: Secondary | ICD-10-CM | POA: Diagnosis not present

## 2022-01-09 DIAGNOSIS — I700 Atherosclerosis of aorta: Secondary | ICD-10-CM | POA: Diagnosis not present

## 2022-01-09 DIAGNOSIS — E871 Hypo-osmolality and hyponatremia: Secondary | ICD-10-CM | POA: Diagnosis not present

## 2022-01-09 DIAGNOSIS — Z1331 Encounter for screening for depression: Secondary | ICD-10-CM | POA: Diagnosis not present

## 2022-01-09 DIAGNOSIS — M4854XA Collapsed vertebra, not elsewhere classified, thoracic region, initial encounter for fracture: Secondary | ICD-10-CM | POA: Diagnosis not present

## 2022-01-09 DIAGNOSIS — M48061 Spinal stenosis, lumbar region without neurogenic claudication: Secondary | ICD-10-CM | POA: Diagnosis not present

## 2022-01-09 DIAGNOSIS — I739 Peripheral vascular disease, unspecified: Secondary | ICD-10-CM | POA: Diagnosis not present

## 2022-01-09 DIAGNOSIS — Z0000 Encounter for general adult medical examination without abnormal findings: Secondary | ICD-10-CM | POA: Diagnosis not present

## 2022-01-13 DIAGNOSIS — I6523 Occlusion and stenosis of bilateral carotid arteries: Secondary | ICD-10-CM | POA: Diagnosis not present

## 2022-01-13 DIAGNOSIS — R0989 Other specified symptoms and signs involving the circulatory and respiratory systems: Secondary | ICD-10-CM | POA: Diagnosis not present

## 2022-02-20 ENCOUNTER — Ambulatory Visit (HOSPITAL_COMMUNITY)
Admission: RE | Admit: 2022-02-20 | Discharge: 2022-02-20 | Disposition: A | Discharge status: Home / Self Care | Payer: PPO | Source: Ambulatory Visit | Attending: Cardiology | Admitting: Cardiology

## 2022-02-20 DIAGNOSIS — I739 Peripheral vascular disease, unspecified: Secondary | ICD-10-CM | POA: Diagnosis not present

## 2022-02-28 ENCOUNTER — Encounter: Payer: Self-pay | Admitting: Cardiovascular Disease

## 2022-02-28 ENCOUNTER — Ambulatory Visit: Payer: PPO | Attending: Cardiovascular Disease | Admitting: Cardiovascular Disease

## 2022-02-28 VITALS — BP 122/60 | HR 57 | Ht 69.0 in | Wt 173.4 lb

## 2022-02-28 DIAGNOSIS — I10 Essential (primary) hypertension: Secondary | ICD-10-CM

## 2022-02-28 DIAGNOSIS — I739 Peripheral vascular disease, unspecified: Secondary | ICD-10-CM

## 2022-02-28 DIAGNOSIS — E782 Mixed hyperlipidemia: Secondary | ICD-10-CM | POA: Diagnosis not present

## 2022-02-28 NOTE — Assessment & Plan Note (Signed)
History of peripheral arterial disease status post angiography by myself/20/23 revealing subtotally occluded highly calcified left common femoral artery stenosis, mid short left SFA CTO and moderate mid right SFA disease.  He underwent left common femoral endarterectomy with patch angioplasty by Dr. Carlis Abbott 07/20/2021 which resulted in marked improvement in his claudication.  His most recent Doppler studies performed 02/20/2022 revealed a right ABI of 0.94, left of 0.94 with a widely patent left common femoral artery, occluded proximal left SFA.  He currently denies claudication.  Will continue to get Dopplers on him on annual basis.

## 2022-02-28 NOTE — Progress Notes (Signed)
02/28/2022 Alex Franklin   Jun 03, 1940  [id number]  Primary Physician Wenda Low, MD Primary Cardiologist: Lorretta Harp MD Alex Franklin, [place]  HPI:  Alex Franklin is a 81 y.o.  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  His wife Alex Franklin is a patient of mine as well.  He was referred to me for PAD by his primary cardiologist Dr.Chandrasekhar for lifestyle-limiting claudication and now I follow him for general cardiology as well..  I last saw him in the office 08/23/2021.  His risk factors include discontinue tobacco abuse having stopped 7 years ago and smoked 30 to 50 pack years.  History of hypertension and hyperlipidemia.  His mother had CABG at brother has CAD as well.  He is never had a heart attack or stroke.  He is fairly active and walks and plays golf.  Does complain of left lower extremity claudication.  He had Doppler studies performed in our office 06/06/2021 revealing a right ABI of 0.93 and a left of 0.61 with a moderate lesion in his mid right SFA and a high-grade lesion in his distal left common femoral artery.  I performed peripheral angiography on him 06/27/2021 revealing an occluded distal left common femoral artery which was calcified extending into the origin of the profunda and proximal left SFA as well as a short segment mid left SFA CTO with two-vessel runoff.  I believe he would be best served by surgical revascularization of this, involving a common femoral endarterectomy with patch angioplasty as well as profundoplasty.  I referred him to Dr. Fortunato Curling for surgical evaluation.  He underwent left common femoral endarterectomy with patch angioplasty by Dr. Carlis Abbott 07/20/2021 he was discharged home the following day.  His Dopplers performed 08/16/2021 revealed an increase in his left ABI from 0.61-0.88.  His claudication has resolved.  Since I saw him 6 months ago he  continues to do well.  He denies claudication.  His most recent Dopplers performed 02/20/2022 revealed ABIs of 0.94 bilaterally with widely patent left common femoral artery and occluded proximal left SFA.  He is completely asymptomatic.  He specifically denies chest pain or shortness of breath.   Current Meds  Medication Sig   acetaminophen (TYLENOL) 500 MG tablet Take 1,000 mg by mouth daily as needed for moderate pain.   amLODipine (NORVASC) 10 MG tablet Take 10 mg by mouth every morning.   aspirin EC 81 MG tablet Take 81 mg by mouth every morning.   Cholecalciferol (DIALYVITE VITAMIN D 5000) 125 MCG (5000 UT) capsule Take 5,000 Units by mouth daily.   doxazosin (CARDURA) 4 MG tablet Take 4 mg by mouth at bedtime.   ibuprofen (ADVIL) 200 MG tablet Take 600 mg by mouth daily as needed for moderate pain.   losartan (COZAAR) 100 MG tablet Take 100 mg by mouth daily.   Omega-3 Fatty Acids (FISH OIL) 1000 MG CAPS Take 1,000 mg by mouth daily.   simvastatin (ZOCOR) 20 MG tablet Take 20 mg by mouth every morning.   SYMBICORT 80-4.5 MCG/ACT inhaler Inhale 2 puffs into the lungs 2 (two) times daily as needed (shortness of breath).   Current Facility-Administered Medications for the 02/28/22 encounter (Office Visit) with Lorretta Harp, MD  Medication   sodium chloride flush (NS) 0.9 % injection 3 mL     No Known Allergies  Social History   Socioeconomic History   Marital  status: Married    Spouse name: Not on file   Number of children: Not on file   Years of education: Not on file   Highest education level: Not on file  Occupational History   Not on file  Tobacco Use   Smoking status: Former    Types: Cigarettes    Quit date: 07/13/2013    Years since quitting: 8.6   Smokeless tobacco: Not on file  Vaping Use   Vaping Use: Never used  Substance and Sexual Activity   Alcohol use: Yes    Comment: occasionally- every couple of months   Drug use: No   Sexual activity: Not on file   Other Topics Concern   Not on file  Social History Narrative   Not on file   Social Determinants of Health   Financial Resource Strain: Not on file  Food Insecurity: Not on file  Transportation Needs: Not on file  Physical Activity: Not on file  Stress: Not on file  Social Connections: Not on file  Intimate Partner Violence: Not on file     Review of Systems: General: negative for chills, fever, night sweats or weight changes.  Cardiovascular: negative for chest pain, dyspnea on exertion, edema, orthopnea, palpitations, paroxysmal nocturnal dyspnea or shortness of breath Dermatological: negative for rash Respiratory: negative for cough or wheezing Urologic: negative for hematuria Abdominal: negative for nausea, vomiting, diarrhea, bright red blood per rectum, melena, or hematemesis Neurologic: negative for visual changes, syncope, or dizziness All other systems reviewed and are otherwise negative except as noted above.    Blood pressure 122/60, pulse (!) 57, height '5\' 9"'$  (1.753 m), weight 173 lb 6.4 oz (78.7 kg), SpO2 97 %.  General appearance: alert and no distress Neck: no adenopathy, no JVD, supple, symmetrical, trachea midline, thyroid not enlarged, symmetric, no tenderness/mass/nodules, and left carotid bruit Lungs: clear to auscultation bilaterally Heart: regular rate and rhythm, S1, S2 normal, no murmur, click, rub or gallop Extremities: extremities normal, atraumatic, no cyanosis or edema Pulses: Diminished pedal pulses Skin: Skin color, texture, turgor normal. No rashes or lesions Neurologic: Grossly normal  EKG sinus bradycardia at 57 with right bundle branch block.  I personally reviewed this EKG.  ASSESSMENT AND PLAN:   Peripheral arterial disease ([place]) History of peripheral arterial disease status post angiography by myself/20/23 revealing subtotally occluded highly calcified left common femoral artery stenosis, mid short left SFA CTO and moderate mid right  SFA disease.  He underwent left common femoral endarterectomy with patch angioplasty by Dr. Carlis Abbott 07/20/2021 which resulted in marked improvement in his claudication.  His most recent Doppler studies performed 02/20/2022 revealed a right ABI of 0.94, left of 0.94 with a widely patent left common femoral artery, occluded proximal left SFA.  He currently denies claudication.  Will continue to get Dopplers on him on annual basis.  Essential hypertension History of essential hypertension a blood pressure measured today 122/60.  He is on amlodipine, and losartan.  Hyperlipidemia History of hyperlipidemia on statin therapy with lipid profile performed 01/09/2022 revealing total cholesterol 135, LDL 66 and HDL 54.     Lorretta Harp MD [facility], [facility] 02/28/2022 8:45 AM

## 2022-02-28 NOTE — Patient Instructions (Signed)
Medication Instructions:  Your physician recommends that you continue on your current medications as directed. Please refer to the Current Medication list given to you today.  *If you need a refill on your cardiac medications before your next appointment, please call your pharmacy*   Testing/Procedures: Your physician has requested that you have a carotid duplex. This test is an ultrasound of the carotid arteries in your neck. It looks at blood flow through these arteries that supply the brain with blood. Allow one hour for this exam. There are no restrictions or special instructions. This will take place at [place], [address]. To do now.   Your physician has requested that you have a lower extremity arterial duplex. During this test, ultrasound is used to evaluate arterial blood flow in the legs. Allow one hour for this exam. There are no restrictions or special instructions. This will take place at [place], [address]. To be done in December 2024.  Your physician has requested that you have an ankle brachial index (ABI). During this test an ultrasound and blood pressure cuff are used to evaluate the arteries that supply the arms and legs with blood. Allow thirty minutes for this exam. There are no restrictions or special instructions. This will take place at [place], [address].  To be done in December 2024.    Follow-Up: At [facility], you and your health needs are our priority.  As part of our continuing mission to provide you with exceptional heart care, we have created designated Provider Care Teams.  These Care Teams include your primary Cardiologist (physician) and Advanced Practice Providers (APPs -  Physician Assistants and Nurse Practitioners) who all work together to provide you with the care you need, when you need it.  We recommend signing up for the patient portal called "MyChart".  Sign up information is provided on this After Visit  Summary.  MyChart is used to connect with patients for Virtual Visits (Telemedicine).  Patients are able to view lab/test results, encounter notes, upcoming appointments, etc.  Non-urgent messages can be sent to your provider as well.   To learn more about what you can do with MyChart, go to [email or web address].    Your next appointment:   12 month(s)  The format for your next appointment:   In Person  Provider:   [name], MD

## 2022-02-28 NOTE — Assessment & Plan Note (Signed)
History of essential hypertension a blood pressure measured today 122/60.  He is on amlodipine, and losartan.

## 2022-02-28 NOTE — Assessment & Plan Note (Signed)
History of hyperlipidemia on statin therapy with lipid profile performed 01/09/2022 revealing total cholesterol 135, LDL 66 and HDL 54.

## 2022-03-03 ENCOUNTER — Ambulatory Visit (HOSPITAL_COMMUNITY)
Admission: RE | Admit: 2022-03-03 | Discharge: 2022-03-03 | Disposition: A | Discharge status: Home / Self Care | Payer: PPO | Source: Ambulatory Visit | Attending: Cardiovascular Disease | Admitting: Cardiovascular Disease

## 2022-03-03 ENCOUNTER — Other Ambulatory Visit: Payer: Self-pay | Admitting: Cardiovascular Disease

## 2022-03-03 DIAGNOSIS — I739 Peripheral vascular disease, unspecified: Secondary | ICD-10-CM

## 2022-03-03 DIAGNOSIS — R0989 Other specified symptoms and signs involving the circulatory and respiratory systems: Secondary | ICD-10-CM

## 2022-03-03 DIAGNOSIS — I10 Essential (primary) hypertension: Secondary | ICD-10-CM | POA: Diagnosis not present

## 2022-03-03 DIAGNOSIS — E782 Mixed hyperlipidemia: Secondary | ICD-10-CM

## 2022-03-30 DIAGNOSIS — U071 COVID-19: Secondary | ICD-10-CM | POA: Diagnosis not present

## 2022-03-30 DIAGNOSIS — R059 Cough, unspecified: Secondary | ICD-10-CM | POA: Diagnosis not present

## 2022-03-30 DIAGNOSIS — R5383 Other fatigue: Secondary | ICD-10-CM | POA: Diagnosis not present

## 2022-03-30 DIAGNOSIS — R509 Fever, unspecified: Secondary | ICD-10-CM | POA: Diagnosis not present

## 2022-04-07 DIAGNOSIS — C61 Malignant neoplasm of prostate: Secondary | ICD-10-CM | POA: Diagnosis not present

## 2022-04-11 DIAGNOSIS — C44622 Squamous cell carcinoma of skin of right upper limb, including shoulder: Secondary | ICD-10-CM | POA: Diagnosis not present

## 2022-04-11 DIAGNOSIS — L570 Actinic keratosis: Secondary | ICD-10-CM | POA: Diagnosis not present

## 2022-04-11 DIAGNOSIS — L821 Other seborrheic keratosis: Secondary | ICD-10-CM | POA: Diagnosis not present

## 2022-04-17 DIAGNOSIS — H59813 Chorioretinal scars after surgery for detachment, bilateral: Secondary | ICD-10-CM | POA: Diagnosis not present

## 2022-04-17 DIAGNOSIS — H5211 Myopia, right eye: Secondary | ICD-10-CM | POA: Diagnosis not present

## 2022-04-17 DIAGNOSIS — H52222 Regular astigmatism, left eye: Secondary | ICD-10-CM | POA: Diagnosis not present

## 2022-04-17 DIAGNOSIS — Z961 Presence of intraocular lens: Secondary | ICD-10-CM | POA: Diagnosis not present

## 2022-04-17 DIAGNOSIS — H5202 Hypermetropia, left eye: Secondary | ICD-10-CM | POA: Diagnosis not present

## 2022-04-17 DIAGNOSIS — H18053 Posterior corneal pigmentations, bilateral: Secondary | ICD-10-CM | POA: Diagnosis not present

## 2022-07-10 DIAGNOSIS — D692 Other nonthrombocytopenic purpura: Secondary | ICD-10-CM | POA: Diagnosis not present

## 2022-07-10 DIAGNOSIS — Z85828 Personal history of other malignant neoplasm of skin: Secondary | ICD-10-CM | POA: Diagnosis not present

## 2022-07-12 DIAGNOSIS — J849 Interstitial pulmonary disease, unspecified: Secondary | ICD-10-CM | POA: Diagnosis not present

## 2022-07-12 DIAGNOSIS — M4807 Spinal stenosis, lumbosacral region: Secondary | ICD-10-CM | POA: Diagnosis not present

## 2022-07-12 DIAGNOSIS — J440 Chronic obstructive pulmonary disease with acute lower respiratory infection: Secondary | ICD-10-CM | POA: Diagnosis not present

## 2022-07-12 DIAGNOSIS — E871 Hypo-osmolality and hyponatremia: Secondary | ICD-10-CM | POA: Diagnosis not present

## 2022-07-12 DIAGNOSIS — C61 Malignant neoplasm of prostate: Secondary | ICD-10-CM | POA: Diagnosis not present

## 2022-07-12 DIAGNOSIS — I10 Essential (primary) hypertension: Secondary | ICD-10-CM | POA: Diagnosis not present

## 2022-07-12 DIAGNOSIS — I739 Peripheral vascular disease, unspecified: Secondary | ICD-10-CM | POA: Diagnosis not present

## 2022-07-12 DIAGNOSIS — I700 Atherosclerosis of aorta: Secondary | ICD-10-CM | POA: Diagnosis not present

## 2022-07-28 DIAGNOSIS — M47816 Spondylosis without myelopathy or radiculopathy, lumbar region: Secondary | ICD-10-CM | POA: Diagnosis not present

## 2022-08-10 DIAGNOSIS — M545 Low back pain, unspecified: Secondary | ICD-10-CM | POA: Diagnosis not present

## 2022-08-14 DIAGNOSIS — M47816 Spondylosis without myelopathy or radiculopathy, lumbar region: Secondary | ICD-10-CM | POA: Diagnosis not present

## 2022-08-24 DIAGNOSIS — M47816 Spondylosis without myelopathy or radiculopathy, lumbar region: Secondary | ICD-10-CM | POA: Diagnosis not present

## 2022-08-28 DIAGNOSIS — F17210 Nicotine dependence, cigarettes, uncomplicated: Secondary | ICD-10-CM | POA: Diagnosis not present

## 2022-08-28 DIAGNOSIS — Z8546 Personal history of malignant neoplasm of prostate: Secondary | ICD-10-CM | POA: Diagnosis not present

## 2022-08-28 DIAGNOSIS — R1032 Left lower quadrant pain: Secondary | ICD-10-CM | POA: Diagnosis not present

## 2022-08-28 DIAGNOSIS — R634 Abnormal weight loss: Secondary | ICD-10-CM | POA: Diagnosis not present

## 2022-08-28 DIAGNOSIS — Z6822 Body mass index (BMI) 22.0-22.9, adult: Secondary | ICD-10-CM | POA: Diagnosis not present

## 2022-08-28 DIAGNOSIS — M4854XD Collapsed vertebra, not elsewhere classified, thoracic region, subsequent encounter for fracture with routine healing: Secondary | ICD-10-CM | POA: Diagnosis not present

## 2022-08-30 ENCOUNTER — Other Ambulatory Visit: Payer: Self-pay | Admitting: Family Medicine

## 2022-08-30 DIAGNOSIS — R634 Abnormal weight loss: Secondary | ICD-10-CM

## 2022-09-04 ENCOUNTER — Ambulatory Visit
Admission: RE | Admit: 2022-09-04 | Discharge: 2022-09-04 | Disposition: A | Discharge status: Home / Self Care | Payer: Medicare HMO | Source: Ambulatory Visit | Attending: Family Medicine | Admitting: Family Medicine

## 2022-09-04 DIAGNOSIS — R911 Solitary pulmonary nodule: Secondary | ICD-10-CM | POA: Diagnosis not present

## 2022-09-04 DIAGNOSIS — R634 Abnormal weight loss: Secondary | ICD-10-CM | POA: Diagnosis not present

## 2022-09-04 DIAGNOSIS — N281 Cyst of kidney, acquired: Secondary | ICD-10-CM | POA: Diagnosis not present

## 2022-09-04 DIAGNOSIS — Z8507 Personal history of malignant neoplasm of pancreas: Secondary | ICD-10-CM | POA: Diagnosis not present

## 2022-09-04 MED ORDER — IOPAMIDOL (ISOVUE-300) INJECTION 61%
125.0000 mL | Freq: Once | INTRAVENOUS | Status: AC | PRN
  Administered 2022-09-04: 125 mL via INTRAVENOUS

## 2022-09-05 DIAGNOSIS — M47816 Spondylosis without myelopathy or radiculopathy, lumbar region: Secondary | ICD-10-CM | POA: Diagnosis not present

## 2022-09-11 DIAGNOSIS — I10 Essential (primary) hypertension: Secondary | ICD-10-CM | POA: Diagnosis not present

## 2022-09-11 DIAGNOSIS — M4854XD Collapsed vertebra, not elsewhere classified, thoracic region, subsequent encounter for fracture with routine healing: Secondary | ICD-10-CM | POA: Diagnosis not present

## 2022-09-11 DIAGNOSIS — Z6822 Body mass index (BMI) 22.0-22.9, adult: Secondary | ICD-10-CM | POA: Diagnosis not present

## 2022-09-11 DIAGNOSIS — R768 Other specified abnormal immunological findings in serum: Secondary | ICD-10-CM | POA: Diagnosis not present

## 2022-09-20 DIAGNOSIS — M47816 Spondylosis without myelopathy or radiculopathy, lumbar region: Secondary | ICD-10-CM | POA: Diagnosis not present

## 2022-10-16 DIAGNOSIS — M4854XD Collapsed vertebra, not elsewhere classified, thoracic region, subsequent encounter for fracture with routine healing: Secondary | ICD-10-CM | POA: Diagnosis not present

## 2022-10-16 DIAGNOSIS — M159 Polyosteoarthritis, unspecified: Secondary | ICD-10-CM | POA: Diagnosis not present

## 2022-10-16 DIAGNOSIS — Z6822 Body mass index (BMI) 22.0-22.9, adult: Secondary | ICD-10-CM | POA: Diagnosis not present

## 2022-10-16 DIAGNOSIS — N1831 Chronic kidney disease, stage 3a: Secondary | ICD-10-CM | POA: Diagnosis not present

## 2022-10-16 DIAGNOSIS — I10 Essential (primary) hypertension: Secondary | ICD-10-CM | POA: Diagnosis not present

## 2023-01-16 DIAGNOSIS — E7800 Pure hypercholesterolemia, unspecified: Secondary | ICD-10-CM | POA: Diagnosis not present

## 2023-01-16 DIAGNOSIS — N1831 Chronic kidney disease, stage 3a: Secondary | ICD-10-CM | POA: Diagnosis not present

## 2023-02-05 DIAGNOSIS — M47816 Spondylosis without myelopathy or radiculopathy, lumbar region: Secondary | ICD-10-CM | POA: Diagnosis not present

## 2023-02-09 ENCOUNTER — Telehealth: Payer: Self-pay | Admitting: Cardiovascular Disease

## 2023-02-09 NOTE — Telephone Encounter (Signed)
Patient stated he was having a burning sensation and some pain in his groin area.  Patient noted he has an LE arterial with ABI test on 12/18.

## 2023-02-09 NOTE — Telephone Encounter (Signed)
Pt reports stinging/discomfort in left groin area  Reports that this stinging comes and goes- Sometimes hurts during walking, sometimes not- right now there is some discomfort in the area while just sitting there with no activity. Reports no redness, no swelling, no tenderness to the touch.  He does report that the discomfort is worse when he is driving. Appears to not hurt as much when he is able to "stretch his legs out" when he is sitting.  Has history of blockage on left leg and surgery/stent- and he thinks this is the area that "they went into," to do this procedure  He has an LE arterial and ABI scheduled for Thursday; he was wondering if a scan of this area can be added as well.  Informed patient that I would ask the provider and be back in touch.

## 2023-02-09 NOTE — Telephone Encounter (Signed)
Discussed the patient's information with Dr Antoine Poche (DOD) and he does not feel that any further action or testing is needed at this time.  I called the patient and gave him this information. Also stated that this information is being sent to Dr Allyson Sabal as well and if he feels anything needs to be added then the patient will be notified.  Instructed the patient to go to the ER if pain increased/redness/swelling/tenderness at the site- he verbalized understanding.

## 2023-02-21 ENCOUNTER — Ambulatory Visit (HOSPITAL_COMMUNITY)
Admission: RE | Admit: 2023-02-21 | Discharge: 2023-02-21 | Disposition: A | Discharge status: Home / Self Care | Payer: Medicare HMO | Source: Ambulatory Visit | Attending: Cardiovascular Disease | Admitting: Cardiovascular Disease

## 2023-02-21 DIAGNOSIS — E782 Mixed hyperlipidemia: Secondary | ICD-10-CM | POA: Diagnosis not present

## 2023-02-21 DIAGNOSIS — I739 Peripheral vascular disease, unspecified: Secondary | ICD-10-CM | POA: Insufficient documentation

## 2023-02-21 DIAGNOSIS — I10 Essential (primary) hypertension: Secondary | ICD-10-CM | POA: Insufficient documentation

## 2023-02-22 LAB — VAS US ABI WITH/WO TBI
Left ABI: 0.85
Right ABI: 0.87

## 2023-02-26 ENCOUNTER — Ambulatory Visit: Payer: Medicare HMO | Attending: Cardiovascular Disease | Admitting: Cardiovascular Disease

## 2023-02-26 ENCOUNTER — Encounter: Payer: Self-pay | Admitting: Cardiovascular Disease

## 2023-02-26 VITALS — BP 120/60 | Ht 66.0 in | Wt 164.6 lb

## 2023-02-26 DIAGNOSIS — I10 Essential (primary) hypertension: Secondary | ICD-10-CM | POA: Diagnosis not present

## 2023-02-26 DIAGNOSIS — I739 Peripheral vascular disease, unspecified: Secondary | ICD-10-CM | POA: Diagnosis not present

## 2023-02-26 DIAGNOSIS — C61 Malignant neoplasm of prostate: Secondary | ICD-10-CM

## 2023-02-26 DIAGNOSIS — E782 Mixed hyperlipidemia: Secondary | ICD-10-CM | POA: Diagnosis not present

## 2023-02-26 NOTE — Assessment & Plan Note (Signed)
History of essential hypertension with blood pressure measured today at 120/60.  He is on amlodipine, and losartan.

## 2023-02-26 NOTE — Assessment & Plan Note (Signed)
History of hyperlipidemia on statin therapy with lipid profile performed 01/16/2023 revealing a total cholesterol 151, LDL 72 and HDL 62.

## 2023-02-26 NOTE — Progress Notes (Signed)
02/26/2023 TAJOHN TALBERT   09/20/1940  [id number]  Primary Physician Georgann Housekeeper, MD Primary Cardiologist: Runell Gess MD Nicholes Calamity, [place]  HPI:  Alex Franklin is a 82 y.o.   thin and fit appearing married Caucasian male father of 2, grandfather of 3 grandchildren who worked in the [employer] business.  His wife Eber Jones is a patient of mine as well and she accompanies him today.  He was referred to me for PAD by his primary cardiologist Dr.Chandrasekhar for lifestyle-limiting claudication and now I follow him for general cardiology as well..  I last saw him in the office 02/28/2022.  His risk factors include 30 to 50 pack years of tobacco abuse currently smoking 1/2 pack/day.Marland Kitchen  History of hypertension and hyperlipidemia.  His mother had CABG at brother has CAD as well.  He is never had a heart attack or stroke.  He is fairly active and walks and plays golf.  Does complain of left lower extremity claudication.  He had Doppler studies performed in our office 06/06/2021 revealing a right ABI of 0.93 and a left of 0.61 with a moderate lesion in his mid right SFA and a high-grade lesion in his distal left common femoral artery.  I performed peripheral angiography on him 06/27/2021 revealing an occluded distal left common femoral artery which was calcified extending into the origin of the profunda and proximal left SFA as well as a short segment mid left SFA CTO with two-vessel runoff.  I believe he would be best served by surgical revascularization of this, involving a common femoral endarterectomy with patch angioplasty as well as profundoplasty.  I referred him to Dr. Clotilde Dieter for surgical evaluation.  He underwent left common femoral endarterectomy with patch angioplasty by Dr. Chestine Spore 07/20/2021 he was discharged home the following day.  His Dopplers performed 08/16/2021 revealed an increase in his left ABI from 0.61-0.88.  His claudication has resolved.   Since I saw him 6  months ago he continues to do well.  He denies claudication.  He does have a burning sensation in his left inner thigh which does not sound vascular in nature.  His most recent Dopplers performed 02/21/2023 revealed ABIs of 0.94 bilaterally with widely patent left common femoral artery and occluded proximal left SFA.  He is completely asymptomatic.  He specifically denies chest pain or shortness of breath.     No outpatient medications have been marked as taking for the 02/26/23 encounter (Office Visit) with Runell Gess, MD.   Current Facility-Administered Medications for the 02/26/23 encounter (Office Visit) with Runell Gess, MD  Medication   sodium chloride flush (NS) 0.9 % injection 3 mL     No Known Allergies  Social History   Socioeconomic History   Marital status: Married    Spouse name: Not on file   Number of children: Not on file   Years of education: Not on file   Highest education level: Not on file  Occupational History   Not on file  Tobacco Use   Smoking status: Former    Current packs/day: 0.00    Types: Cigarettes    Quit date: 07/13/2013    Years since quitting: 9.6   Smokeless tobacco: Not on file  Vaping Use   Vaping status: Never Used  Substance and Sexual Activity   Alcohol use: Yes    Comment: occasionally- every couple of months   Drug use: No   Sexual activity: Not on file  Other Topics Concern   Not on file  Social History Narrative   Not on file   Social Drivers of Health   Financial Resource Strain: Not on file  Food Insecurity: Not on file  Transportation Needs: Not on file  Physical Activity: Not on file  Stress: Not on file  Social Connections: Not on file  Intimate Partner Violence: Not on file     Review of Systems: General: negative for chills, fever, night sweats or weight changes.  Cardiovascular: negative for chest pain, dyspnea on exertion, edema, orthopnea, palpitations, paroxysmal nocturnal dyspnea or shortness  of breath Dermatological: negative for rash Respiratory: negative for cough or wheezing Urologic: negative for hematuria Abdominal: negative for nausea, vomiting, diarrhea, bright red blood per rectum, melena, or hematemesis Neurologic: negative for visual changes, syncope, or dizziness All other systems reviewed and are otherwise negative except as noted above.    Blood pressure 120/60, height 5\' 6"  (1.676 m), weight 164 lb 9.6 oz (74.7 kg), SpO2 92%.  General appearance: alert and no distress Neck: no adenopathy, no JVD, supple, symmetrical, trachea midline, thyroid not enlarged, symmetric, no tenderness/mass/nodules, and right carotid Dilia Alemany Lungs: clear to auscultation bilaterally Heart: regular rate and rhythm, S1, S2 normal, no murmur, click, rub or gallop Extremities: extremities normal, atraumatic, no cyanosis or edema Pulses: Decreased pedal pulses Skin: Skin color, texture, turgor normal. No rashes or lesions Neurologic: Grossly normal  EKG sinus rhythm at 60 with right bundle branch block.  I personally reviewed this EKG.      ASSESSMENT AND PLAN:   Peripheral arterial disease (HCC) History of PAD with left lower extremity claudication status post angiography by myself/24/23 revealing an occluded distal left common femoral artery extending into the proximal left SFA, occluded mid left SFA with two-vessel runoff.  I ultimately referred him to Dr. Chestine Spore who performed left common femoral endarterectomy with patch angioplasty 07/20/2021 with a increase in his left ABI from 0.61-0.88.  His claudication resolved.  He still denies claudication but does have a burning sensation in his left inner thigh.  Recent Dopplers performed 02/21/2023 revealed a right ABI of 0.87, left of 0.86 with a patent left common femoral artery and an occluded mid left SFA.  Essential hypertension History of essential hypertension with blood pressure measured today at 120/60.  He is on amlodipine, and  losartan.  Hyperlipidemia History of hyperlipidemia on statin therapy with lipid profile performed 01/16/2023 revealing a total cholesterol 151, LDL 72 and HDL 62.     Runell Gess MD [facility], [facility] 02/26/2023 9:31 AM

## 2023-02-26 NOTE — Assessment & Plan Note (Signed)
History of PAD with left lower extremity claudication status post angiography by myself/24/23 revealing an occluded distal left common femoral artery extending into the proximal left SFA, occluded mid left SFA with two-vessel runoff.  I ultimately referred him to Dr. Chestine Spore who performed left common femoral endarterectomy with patch angioplasty 07/20/2021 with a increase in his left ABI from 0.61-0.88.  His claudication resolved.  He still denies claudication but does have a burning sensation in his left inner thigh.  Recent Dopplers performed 02/21/2023 revealed a right ABI of 0.87, left of 0.86 with a patent left common femoral artery and an occluded mid left SFA.

## 2023-02-26 NOTE — Patient Instructions (Signed)
Medication Instructions:  Your physician recommends that you continue on your current medications as directed. Please refer to the Current Medication list given to you today.  *If you need a refill on your cardiac medications before your next appointment, please call your pharmacy*   Testing/Procedures: Your physician has requested that you have a lower extremity arterial duplex. During this test, ultrasound is used to evaluate arterial blood flow in the legs. Allow one hour for this exam. There are no restrictions or special instructions. This will take place at [address] [facility], [address]. **To do in December 2025**  Please note: We ask at that you not bring children with you during ultrasound (echo/ vascular) testing. Due to room size and safety concerns, children are not allowed in the ultrasound rooms during exams. Our front office staff cannot provide observation of children in our lobby area while testing is being conducted. An adult accompanying a patient to their appointment will only be allowed in the ultrasound room at the discretion of the ultrasound technician under special circumstances. We apologize for any inconvenience.  Your physician has requested that you have an ankle brachial index (ABI). During this test an ultrasound and blood pressure cuff are used to evaluate the arteries that supply the arms and legs with blood. Allow thirty minutes for this exam. There are no restrictions or special instructions. This will take place at [address] [facility], [address]. **To do in December 2025**   Please note: We ask at that you not bring children with you during ultrasound (echo/ vascular) testing. Due to room size and safety concerns, children are not allowed in the ultrasound rooms during exams. Our front office staff cannot provide observation of children in our lobby area while testing is being conducted. An adult accompanying a patient to their appointment will only be allowed in the  ultrasound room at the discretion of the ultrasound technician under special circumstances. We apologize for any inconvenience.    Follow-Up: At [facility], you and your health needs are our priority.  As part of our continuing mission to provide you with exceptional heart care, we have created designated Provider Care Teams.  These Care Teams include your primary Cardiologist (physician) and Advanced Practice Providers (APPs -  Physician Assistants and Nurse Practitioners) who all work together to provide you with the care you need, when you need it.  We recommend signing up for the patient portal called "MyChart".  Sign up information is provided on this After Visit Summary.  MyChart is used to connect with patients for Virtual Visits (Telemedicine).  Patients are able to view lab/test results, encounter notes, upcoming appointments, etc.  Non-urgent messages can be sent to your provider as well.   To learn more about what you can do with MyChart, go to [email or web address].    Your next appointment:   12 month(s)  Provider:   Nanetta Batty, MD

## 2023-03-20 DIAGNOSIS — M47816 Spondylosis without myelopathy or radiculopathy, lumbar region: Secondary | ICD-10-CM | POA: Diagnosis not present

## 2023-04-16 DIAGNOSIS — C61 Malignant neoplasm of prostate: Secondary | ICD-10-CM | POA: Diagnosis not present

## 2023-04-27 DIAGNOSIS — I739 Peripheral vascular disease, unspecified: Secondary | ICD-10-CM | POA: Diagnosis not present

## 2023-04-27 DIAGNOSIS — E7800 Pure hypercholesterolemia, unspecified: Secondary | ICD-10-CM | POA: Diagnosis not present

## 2023-04-27 DIAGNOSIS — I10 Essential (primary) hypertension: Secondary | ICD-10-CM | POA: Diagnosis not present

## 2023-05-08 DIAGNOSIS — H524 Presbyopia: Secondary | ICD-10-CM | POA: Diagnosis not present

## 2023-05-08 DIAGNOSIS — H59813 Chorioretinal scars after surgery for detachment, bilateral: Secondary | ICD-10-CM | POA: Diagnosis not present

## 2023-05-08 DIAGNOSIS — H5202 Hypermetropia, left eye: Secondary | ICD-10-CM | POA: Diagnosis not present

## 2023-07-03 DIAGNOSIS — L570 Actinic keratosis: Secondary | ICD-10-CM | POA: Diagnosis not present

## 2023-07-03 DIAGNOSIS — D692 Other nonthrombocytopenic purpura: Secondary | ICD-10-CM | POA: Diagnosis not present

## 2023-07-03 DIAGNOSIS — D2272 Melanocytic nevi of left lower limb, including hip: Secondary | ICD-10-CM | POA: Diagnosis not present

## 2023-07-03 DIAGNOSIS — R29818 Other symptoms and signs involving the nervous system: Secondary | ICD-10-CM | POA: Diagnosis not present

## 2023-07-03 DIAGNOSIS — D2261 Melanocytic nevi of right upper limb, including shoulder: Secondary | ICD-10-CM | POA: Diagnosis not present

## 2023-07-03 DIAGNOSIS — L821 Other seborrheic keratosis: Secondary | ICD-10-CM | POA: Diagnosis not present

## 2023-07-03 DIAGNOSIS — Z85828 Personal history of other malignant neoplasm of skin: Secondary | ICD-10-CM | POA: Diagnosis not present

## 2023-07-20 DIAGNOSIS — J441 Chronic obstructive pulmonary disease with (acute) exacerbation: Secondary | ICD-10-CM | POA: Diagnosis not present

## 2023-07-20 DIAGNOSIS — M47816 Spondylosis without myelopathy or radiculopathy, lumbar region: Secondary | ICD-10-CM | POA: Diagnosis not present

## 2023-07-20 DIAGNOSIS — Z6822 Body mass index (BMI) 22.0-22.9, adult: Secondary | ICD-10-CM | POA: Diagnosis not present

## 2023-07-25 DIAGNOSIS — R29818 Other symptoms and signs involving the nervous system: Secondary | ICD-10-CM | POA: Diagnosis not present

## 2023-07-25 DIAGNOSIS — M5136 Other intervertebral disc degeneration, lumbar region with discogenic back pain only: Secondary | ICD-10-CM | POA: Diagnosis not present

## 2023-07-25 DIAGNOSIS — M48061 Spinal stenosis, lumbar region without neurogenic claudication: Secondary | ICD-10-CM | POA: Diagnosis not present

## 2023-07-25 DIAGNOSIS — M4317 Spondylolisthesis, lumbosacral region: Secondary | ICD-10-CM | POA: Diagnosis not present

## 2023-07-25 DIAGNOSIS — M4808 Spinal stenosis, sacral and sacrococcygeal region: Secondary | ICD-10-CM | POA: Diagnosis not present

## 2023-08-03 DIAGNOSIS — N1831 Chronic kidney disease, stage 3a: Secondary | ICD-10-CM | POA: Diagnosis not present

## 2023-08-16 DIAGNOSIS — M48062 Spinal stenosis, lumbar region with neurogenic claudication: Secondary | ICD-10-CM | POA: Diagnosis not present

## 2023-08-16 DIAGNOSIS — M47816 Spondylosis without myelopathy or radiculopathy, lumbar region: Secondary | ICD-10-CM | POA: Diagnosis not present

## 2023-08-16 DIAGNOSIS — M4316 Spondylolisthesis, lumbar region: Secondary | ICD-10-CM | POA: Diagnosis not present

## 2023-09-03 DIAGNOSIS — J440 Chronic obstructive pulmonary disease with acute lower respiratory infection: Secondary | ICD-10-CM | POA: Diagnosis not present

## 2023-09-03 DIAGNOSIS — N1831 Chronic kidney disease, stage 3a: Secondary | ICD-10-CM | POA: Diagnosis not present

## 2023-09-03 DIAGNOSIS — E7800 Pure hypercholesterolemia, unspecified: Secondary | ICD-10-CM | POA: Diagnosis not present

## 2023-09-03 DIAGNOSIS — M159 Polyosteoarthritis, unspecified: Secondary | ICD-10-CM | POA: Diagnosis not present

## 2023-09-05 DIAGNOSIS — J441 Chronic obstructive pulmonary disease with (acute) exacerbation: Secondary | ICD-10-CM | POA: Diagnosis not present

## 2023-09-20 DIAGNOSIS — M47816 Spondylosis without myelopathy or radiculopathy, lumbar region: Secondary | ICD-10-CM | POA: Diagnosis not present

## 2023-09-21 ENCOUNTER — Encounter: Payer: Self-pay | Admitting: Advanced Practice Midwife

## 2023-10-04 DIAGNOSIS — J440 Chronic obstructive pulmonary disease with acute lower respiratory infection: Secondary | ICD-10-CM | POA: Diagnosis not present

## 2023-10-04 DIAGNOSIS — M159 Polyosteoarthritis, unspecified: Secondary | ICD-10-CM | POA: Diagnosis not present

## 2023-10-04 DIAGNOSIS — E7800 Pure hypercholesterolemia, unspecified: Secondary | ICD-10-CM | POA: Diagnosis not present

## 2023-10-04 DIAGNOSIS — N1831 Chronic kidney disease, stage 3a: Secondary | ICD-10-CM | POA: Diagnosis not present

## 2023-11-04 DIAGNOSIS — M159 Polyosteoarthritis, unspecified: Secondary | ICD-10-CM | POA: Diagnosis not present

## 2023-11-04 DIAGNOSIS — J440 Chronic obstructive pulmonary disease with acute lower respiratory infection: Secondary | ICD-10-CM | POA: Diagnosis not present

## 2023-11-04 DIAGNOSIS — N1831 Chronic kidney disease, stage 3a: Secondary | ICD-10-CM | POA: Diagnosis not present

## 2023-11-04 DIAGNOSIS — E7800 Pure hypercholesterolemia, unspecified: Secondary | ICD-10-CM | POA: Diagnosis not present

## 2023-12-04 DIAGNOSIS — J440 Chronic obstructive pulmonary disease with acute lower respiratory infection: Secondary | ICD-10-CM | POA: Diagnosis not present

## 2023-12-04 DIAGNOSIS — E7800 Pure hypercholesterolemia, unspecified: Secondary | ICD-10-CM | POA: Diagnosis not present

## 2023-12-04 DIAGNOSIS — N1831 Chronic kidney disease, stage 3a: Secondary | ICD-10-CM | POA: Diagnosis not present

## 2023-12-04 DIAGNOSIS — M159 Polyosteoarthritis, unspecified: Secondary | ICD-10-CM | POA: Diagnosis not present

## 2023-12-25 DIAGNOSIS — E7800 Pure hypercholesterolemia, unspecified: Secondary | ICD-10-CM | POA: Diagnosis not present

## 2023-12-25 DIAGNOSIS — M47816 Spondylosis without myelopathy or radiculopathy, lumbar region: Secondary | ICD-10-CM | POA: Diagnosis not present

## 2023-12-25 DIAGNOSIS — J440 Chronic obstructive pulmonary disease with acute lower respiratory infection: Secondary | ICD-10-CM | POA: Diagnosis not present

## 2023-12-31 ENCOUNTER — Telehealth: Payer: Self-pay | Admitting: Radiation Oncology

## 2023-12-31 NOTE — Telephone Encounter (Signed)
 Called to advise pt we will need referral from provider who has been providing tx for arthritis/joint pain. Pt verbalized understanding and that he would reach out to team to send referral.

## 2024-01-04 ENCOUNTER — Telehealth: Payer: Self-pay | Admitting: Radiation Oncology

## 2024-01-04 DIAGNOSIS — E7800 Pure hypercholesterolemia, unspecified: Secondary | ICD-10-CM | POA: Diagnosis not present

## 2024-01-04 DIAGNOSIS — M159 Polyosteoarthritis, unspecified: Secondary | ICD-10-CM | POA: Diagnosis not present

## 2024-01-04 DIAGNOSIS — J440 Chronic obstructive pulmonary disease with acute lower respiratory infection: Secondary | ICD-10-CM | POA: Diagnosis not present

## 2024-01-04 DIAGNOSIS — N1831 Chronic kidney disease, stage 3a: Secondary | ICD-10-CM | POA: Diagnosis not present

## 2024-01-04 NOTE — Telephone Encounter (Signed)
 10/31 @ 11:05 am Received call from patient ready to be sch for consult, unfortunately we have quite a few patients ahead of him.  He is aware we will call him to get him sch as soon as possible.

## 2024-01-14 ENCOUNTER — Ambulatory Visit
Admission: RE | Admit: 2024-01-14 | Discharge: 2024-01-14 | Disposition: A | Discharge status: Home / Self Care | Source: Ambulatory Visit | Attending: Radiation Oncology | Admitting: Radiation Oncology

## 2024-01-14 ENCOUNTER — Encounter: Payer: Self-pay | Admitting: Radiation Oncology

## 2024-01-14 ENCOUNTER — Ambulatory Visit (HOSPITAL_COMMUNITY)
Admission: RE | Admit: 2024-01-14 | Discharge: 2024-01-14 | Disposition: A | Discharge status: Home / Self Care | Source: Ambulatory Visit | Attending: Radiation Oncology | Admitting: Radiation Oncology

## 2024-01-14 VITALS — BP 149/68 | HR 63 | Temp 97.3°F | Resp 18 | Ht 66.0 in | Wt 155.2 lb

## 2024-01-14 DIAGNOSIS — M8580 Other specified disorders of bone density and structure, unspecified site: Secondary | ICD-10-CM | POA: Diagnosis not present

## 2024-01-14 DIAGNOSIS — M47814 Spondylosis without myelopathy or radiculopathy, thoracic region: Secondary | ICD-10-CM | POA: Insufficient documentation

## 2024-01-14 DIAGNOSIS — M47816 Spondylosis without myelopathy or radiculopathy, lumbar region: Secondary | ICD-10-CM | POA: Diagnosis not present

## 2024-01-14 DIAGNOSIS — M48061 Spinal stenosis, lumbar region without neurogenic claudication: Secondary | ICD-10-CM | POA: Diagnosis not present

## 2024-01-14 DIAGNOSIS — M5134 Other intervertebral disc degeneration, thoracic region: Secondary | ICD-10-CM | POA: Diagnosis not present

## 2024-01-14 DIAGNOSIS — M4316 Spondylolisthesis, lumbar region: Secondary | ICD-10-CM | POA: Diagnosis not present

## 2024-01-14 NOTE — Progress Notes (Addendum)
 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 Several years ago   What over the counter or prescription medications have you tried? Ibuprofen  Does anything make the pain better or worse? Somewhat      Ambulatory status? Walker? Wheelchair?: Ambulatory  SAFETY ISSUES: Prior radiation? Yes, prostate cancer 12/16/20 - 01/21/21  Pacemaker/ICD? No Possible current pregnancy? Male Is the patient on methotrexate? No  Current Complaints / other details: He states when he walks the dog he has to take a pause and bend over. And when walking the golf course, it does not hurt to swing but does when I walk the golf course.  BP (!) 149/68 (BP Location: Left Arm, Patient Position: Sitting, Cuff Size: Large)   Pulse 63   Temp (!) 97.3 F (36.3 C)   Resp 18   Ht 5' 6 (1.676 m)   Wt 155 lb 3.2 oz (70.4 kg)   SpO2 97%   BMI 25.05 kg/m

## 2024-01-14 NOTE — Progress Notes (Signed)
 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   Oncology History  Malignant neoplasm of prostate (HCC)  11/28/2001 Cancer Staging   Staging form: Prostate, AJCC 8th Edition - Clinical stage from 11/28/2001: Stage IIB (cT1c, cN0, cM0, PSA: 4.5, Grade Group: 2) - Signed by Sherwood Rise, PA-C on 10/20/2020 Histopathologic type: Adenocarcinoma, NOS Stage prefix: Initial diagnosis Prostate specific antigen (PSA) range: Less than 10 Gleason primary pattern: 3 Gleason secondary pattern: 4 Gleason score: 7 Histologic grading system: 5 grade system Number of biopsy cores examined: 12 Number of biopsy cores positive: 6 Location of positive needle core biopsies: One side   01/15/2002 Cancer Staging   Staging form: Prostate, AJCC 8th Edition - Pathologic stage from 01/15/2002: Stage IIB (pT2, pN0, cM0, PSA: 4.5, Grade Group: 2) - Signed by Sherwood Rise, PA-C on 10/20/2020 Histopathologic type: Adenocarcinoma, NOS Stage prefix: Initial diagnosis Prostate specific antigen (PSA) range: Less than 10 Gleason primary pattern: 3 Gleason secondary pattern: 4 Gleason score: 7 Histologic grading system: 5 grade system Residual tumor (R): R0 - None   10/19/2020 Initial Diagnosis   Prostate cancer (HCC)      HISTORY OF PRESENT ILLNESS: Alex Franklin is a 83 y.o. male seen in consultation for radiation therapy.  He has a hx of prostate cancer from which he is s/p RPP in 2003 and more recently salvage RT with Dr. Patrcia in 2022. He is monitored by Urology for his hx of prostate cancer.  He also has a hx of chronic back pain/spondylosis managed with various  treatments over the years. He is a patient of Dr. Mavis in Neurosurgery and Dr. Cesario in pain medicine. He has tried steroid injections, nerve blocks and prescription and OTC pain medicines. The steroid injections worked years ago but have now lost their efficacy. The nerve block was never effective, nor was the prescription pain medicine. Ibuprofen is somewhat helpful and he takes around 2-4x/day on average, occasionally more if he plays golf. He cannot easily describe the pain. Denies burning pain or pain that radiates down his legs. He feels the pain when standing or walking and it is relieved by leaning forward. He walks the dog around a half a mile a day and has to stop 5 times due to pain. No pain when swinging a golf club. He reports the pain is paraspinal and not exactly in the middle of his back. Prior injections have been completed within the paravertebral facet joints. He reports he was offered surgery by Dr. Mavis but declined.  In regards to imaging, a chest x-ray obtained in 2017 for cough identified an age indeterminate T12 compression fracture. This was followed up with an MRI redemonstrating a chronic T12 compression fracture as well as a mild chronic T1 compression fracture. Circumferential disc osteophytes and mild facet hypertrophy and foraminal stenosis were noted at T11-T12. Facet hypertrophy and foraminal stenosis was also noted at T7-T9.  CT CAP from July 2024 revealed no osseous lesions from his prostate cancer and only chronic compression fractures in the lower T spine.   PREVIOUS RADIATION THERAPY: Yes to prostate fossa in  2022  AUTOIMMUNE DISEASE: No  MEDICAL DEVICES: No  PREGNANCY: No - male   PAST MEDICAL HISTORY:  Past Medical History:  Diagnosis Date   Arthritis    GERD (gastroesophageal reflux disease)    Hypertension    Prostate cancer (HCC) 10/19/2020      PAST SURGICAL HISTORY: Past Surgical History:  Procedure Laterality Date   ABDOMINAL AORTOGRAM  W/LOWER EXTREMITY N/A 06/27/2021   Procedure: ABDOMINAL AORTOGRAM W/LOWER EXTREMITY;  Surgeon: Court Dorn PARAS, MD;  Location: [facility];  Service: Cardiovascular;  Laterality: N/A;   ABSCESS DRAINAGE Left    abscess removal to left palm of hand- during childhood   COLONOSCOPY WITH PROPOFOL  N/A 06/07/2015   Procedure: COLONOSCOPY WITH PROPOFOL ;  Surgeon: Gladis MARLA Louder, MD;  Location: [facility];  Service: Endoscopy;  Laterality: N/A;   ENDARTERECTOMY FEMORAL Left 07/20/2021   Procedure: LEFT COMMON FEMORAL ENDARTERECTOMY;  Surgeon: Gretta Lonni PARAS, MD;  Location: [facility] OR;  Service: Vascular;  Laterality: Left;   EYE SURGERY     bilateral cataract surgery with lens implants   EYE SURGERY     detached retina    PATCH ANGIOPLASTY Left 07/20/2021   Procedure: PATCH PROFUNDAPLASTY WITH 1cm x 14cm XENOSURE BIOLOGIC PATCH;  Surgeon: Gretta Lonni PARAS, MD;  Location: [facility] OR;  Service: Vascular;  Laterality: Left;   prostectomy     TONSILLECTOMY       FAMILY HISTORY: History reviewed. No pertinent family history.   SOCIAL HISTORY:  reports that he quit smoking about 10 years ago. His smoking use included cigarettes. He does not have any smokeless tobacco history on file. He reports current alcohol use. He reports that he does not use drugs.   ALLERGIES: Patient has no known allergies.   MEDICATIONS:  Current Outpatient Medications  Medication Sig Dispense Refill   acetaminophen  (TYLENOL ) 500 MG tablet Take 1,000 mg by mouth daily as needed for moderate pain.     amLODipine  (NORVASC ) 10 MG tablet Take 10 mg by mouth every morning.     aspirin  EC 81 MG tablet Take 81 mg by mouth every morning.     Cholecalciferol  (DIALYVITE VITAMIN D 5000) 125 MCG (5000 UT) capsule Take 5,000 Units by mouth daily.     doxazosin  (CARDURA ) 4 MG tablet Take 4 mg by mouth at bedtime.     ibuprofen (ADVIL) 200 MG tablet Take 600 mg by mouth daily as needed for moderate pain.     losartan   (COZAAR ) 100 MG tablet Take 100 mg by mouth daily.     Omega-3 Fatty Acids (FISH OIL) 1000 MG CAPS Take 1,000 mg by mouth daily.     simvastatin  (ZOCOR ) 20 MG tablet Take 20 mg by mouth every morning.     SYMBICORT 80-4.5 MCG/ACT inhaler Inhale 2 puffs into the lungs 2 (two) times daily as needed (shortness of breath).     Current Facility-Administered Medications  Medication Dose Route Frequency Provider Last Rate Last Admin   sodium chloride  flush (NS) 0.9 % injection 3 mL  3 mL Intravenous Q12H Court Dorn PARAS, MD         REVIEW OF SYSTEMS: The patient reports that he is doing well overall and a review of symptoms is otherwise negative. Denies any other painful areas.      PHYSICAL EXAM:  Wt Readings from Last 3 Encounters:  01/14/24 155 lb 3.2 oz (70.4 kg)  02/26/23 164 lb 9.6 oz (74.7 kg)  02/28/22 173 lb 6.4 oz (78.7  kg)   Temp Readings from Last 3 Encounters:  01/14/24 (!) 97.3 F (36.3 C)  08/09/21 98 F (36.7 C) (Temporal)  07/21/21 97.9 F (36.6 C) (Oral)   BP Readings from Last 3 Encounters:  01/14/24 (!) 149/68  02/26/23 120/60  02/28/22 122/60   Pulse Readings from Last 3 Encounters:  01/14/24 63  02/28/22 (!) 57  08/23/21 (!) 58   Pain Assessment Pain Score: 4  Pain Loc: Back/10   Physical Exam Vitals and nursing note reviewed.  Constitutional:      General: He is not in acute distress. HENT:     Head: Normocephalic.  Eyes:     Extraocular Movements: Extraocular movements intact.  Pulmonary:     Effort: Pulmonary effort is normal. No respiratory distress.  Abdominal:     General: There is no distension.  Musculoskeletal:        General: No tenderness (no tenderness to bony palpation of the lower thoracic/lumbar spine; no paraspinal tenderness to palpation). Normal range of motion.     Cervical back: Normal range of motion.  Skin:    Coloration: Skin is not pale.  Neurological:     General: No focal deficit present.     Mental Status: He  is alert.  Psychiatric:        Mood and Affect: Mood normal.      LABORATORY DATA:  Lab Results  Component Value Date   WBC 10.3 07/21/2021   HGB 11.3 (L) 07/21/2021   HCT 33.8 (L) 07/21/2021   MCV 94.9 07/21/2021   PLT 208 07/21/2021   Lab Results  Component Value Date   NA 137 07/21/2021   K 3.9 07/21/2021   CL 110 07/21/2021   CO2 22 07/21/2021   Lab Results  Component Value Date   ALT 18 07/18/2021   AST 20 07/18/2021   ALKPHOS 73 07/18/2021   BILITOT 0.7 07/18/2021      RADIOGRAPHY:   CT Chest W and AP WWO 09/04/2022:  CLINICAL DATA:  Weight loss. History of prostate cancer.    EXAM: CT CHEST WITH CONTRAST   CT ABDOMEN AND PELVIS WITH AND WITHOUT CONTRAST   TECHNIQUE: Multidetector CT imaging of the chest was performed during intravenous contrast administration. Multidetector CT imaging of the abdomen and pelvis was performed following the standard protocol before and during bolus administration of intravenous contrast.   RADIATION DOSE REDUCTION: This exam was performed according to the departmental dose-optimization program which includes automated exposure control, adjustment of the mA and/or kV according to patient size and/or use of iterative reconstruction technique.   CONTRAST:  125mL ISOVUE -300 IOPAMIDOL  (ISOVUE -300) INJECTION 61%   COMPARISON:  None Available.   FINDINGS: CT CHEST FINDINGS   Cardiovascular: Coronary artery calcification and aortic atherosclerotic calcification.   Mediastinum/Nodes: No axillary or supraclavicular adenopathy. No mediastinal or hilar adenopathy. No pericardial fluid. Esophagus normal.   Lungs/Pleura: Small RIGHT upper lobe pulmonary nodule measures 4 mm (image 31/series 7) larger LEFT lobe nodule measures 8 mm (image 47. Nodules unchanged from comparison PET scan.   Musculoskeletal: No aggressive osseous lesion.   CT ABDOMEN AND PELVIS FINDINGS   Hepatobiliary: No focal hepatic lesion. No biliary  ductal dilatation. Gallbladder is normal. Common bile duct is normal.   Pancreas: Pancreas is normal. No ductal dilatation. No pancreatic inflammation.   Spleen: Normal spleen   Adrenals/urinary tract: Adrenal glands normal. There are bilateral nonenhancing renal cysts postcontrast imaging. Cysts range in size from 1 cm to 4 cm. Ureters  and bladder normal.   Stomach/Bowel: Stomach, small bowel, appendix, and cecum are normal. The colon and rectosigmoid colon are normal.   Vascular/Lymphatic: Abdominal aorta is normal caliber with atherosclerotic calcification. There is no  retroperitoneal or periportal lymphadenopathy. No pelvic lymphadenopathy.   Reproductive: Post prostatectomy   Other: No free fluid.   Musculoskeletal: No aggressive osseous lesion. Chronic compression fractures in lower thoracic spine.   IMPRESSION: CHEST:   1. No evidence of metastatic disease or primary malignancy in the chest 2. Stable bilateral pulmonary nodules favor benign granulomas.   PELVIS:   1. No evidence of metastatic disease or primary carcinoma in the abdomen pelvis. 2. Post prostatectomy. 3. Bilateral Bosniak 1 renal cysts. No follow-up recommended for benign renal lesion. 4.  Aortic Atherosclerosis (ICD10-I70.0).   MRI T Spine WO 04/10/2015:  CLINICAL DATA:  83 year old male with chronic thoracic back pain. Age indeterminate T12 compression fracture on radiographs last month. Subsequent encounter.   EXAM: MRI THORACIC SPINE WITHOUT CONTRAST   TECHNIQUE: Multiplanar, multisequence MR imaging of the thoracic spine was performed. No intravenous contrast was administered.   COMPARISON:  Chest radiographs 03/18/2015 and earlier. Chest CT 02/11/2009.   FINDINGS: Limited sagittal imaging of the cervical spine remarkable for widespread disc space loss and multilevel degenerative endplate spurring.   There is a mild chronic T1 superior endplate compression fracture. The moderate T12  compression fracture as seen in January also is chronic. No associated marrow edema. Intermittent mild superior endplate concavity elsewhere in the thoracic spine, but no other overt thoracic compression fracture. No marrow edema or evidence of acute osseous abnormality.   Negative visualized posterior paraspinal soft tissues. Negative visualized thoracic viscera. Partially visible chronic simple right upper pole renal cyst.   T1-T2: Negative.   T2-T3: Negative.   T3-T4: Negative.   T4-T5: Negative.   T5-T6: Anterior endplate spurring, otherwise negative.   T6-T7: Negative.   T7-T8: Mild facet hypertrophy on the left. Mild bilateral T7 foraminal stenosis.   T8-T9: Mild facet hypertrophy on the right. Mild right T8 foraminal stenosis.   T9-T10: Negative.   T10-T11: Negative.   T11-T12: Mild circumferential disc osteophyte complex, eccentric anteriorly. Narrowing of the ventral CSF space without significant spinal stenosis. Mild facet hypertrophy contributing to mild bilateral T11 foraminal stenosis.   T12-L1:  Mild disc bulge.  No significant stenosis.   Spinal cord signal is within normal limits at all visualized levels. The conus medullaris occurs below the T12-L1 level.   IMPRESSION: 1. No acute osseous abnormality. Recently seen T12 compression fracture is chronic, mild associated bilateral T11 foraminal stenosis but otherwise no complicating features.  2. There is also mild chronic T1 compression. 3. Mild for age thoracic spine degeneration. No thoracic disc herniation or spinal stenosis. Occasional mild degenerative thoracic foraminal stenosis.   Chest x-ray 03/18/2015:  CLINICAL DATA:  Cough.  Interstitial lung disease.   EXAM: CHEST  2 VIEW   COMPARISON:  09/01/2010   FINDINGS: Heart size is normal. Negative for heart failure. Lungs are clear without infiltrate or effusion. No mass or adenopathy.   Moderate compression fracture at approximately T12. This is  probably chronic but was not present previously. Correlate with any pain in the area   Mild hyperinflation of the lungs. Interstitial markings appear normal.   IMPRESSION: Mild hyperinflation without acute abnormality   Moderate compression fracture T12, not present in 2012.     PATHOLOGY: no pertinent pathology     IMPRESSION/PLAN:  Mr. Hislop is an 83 yo  M w/ a hx of prostate cancer and spondylosis/arthritis of the lower back without any evidence of osseous metastatic disease.   After a thorough discussion of the patient's history, prior treatment responses, and current goals, we reviewed the proposed low-dose radiotherapy (LDRT) protocol. Our institutional regimen consists of a total dose of 3 Gy, delivered in 6 fractions of 0.5 Gy on a Monday-Wednesday-Friday schedule over two weeks. This approach aligns with published guidelines and peer-reviewed data for non-malignant musculoskeletal conditions.  The primary aim of LDRT is to reduce chronic joint inflammation and pain and improve mobility, particularly for activities of daily living. This treatment does not reverse structural changes but can help calm inflammation around affected joints. Published data suggest that approximately 60-80% of patients with osteoarthritis experience meaningful pain relief within a few weeks to months after completing LDRT. While response is variable, most patients tolerate treatment well.  Based on imaging, the patient has mild facet hypertrophy and disc osteophytes at multiple thoracic levels. These degenerative changes suggest there may be some potential benefit from LDRT for facet-mediated pain. However, some of his symptoms may be structural. Unlikely his sx are related to nerve impingement given no myelopathy/radiculopathy and no improvement with nerve block. This is positive as it would be much less likely to respond to radiation.  We discussed:  Goals of care: Symptom relief and improved  mobility Expected timeline for benefit: Typically several weeks to months Potential risks: Rare but theoretical concerns regarding late tissue effects or secondary malignancy, which are extremely low at this dose and patient age  Next steps:  Obtain updated radiographs of the thoracic and lumbar spine as well as the sacrum for current structural assessment.  Pending radiographic review, we could consider LDRT to symptomatic areas   Will go ahead and order CT simulation for treatment planning.  The patient was agreeable to this plan.   We personally spent 60 minutes in this encounter including chart review, reviewing radiological studies, meeting face-to-face with the patient, entering orders and completing documentation.    Estefana HERO. Maritza, M.D.

## 2024-01-17 ENCOUNTER — Ambulatory Visit
Admission: RE | Admit: 2024-01-17 | Discharge: 2024-01-17 | Disposition: A | Discharge status: Home / Self Care | Source: Ambulatory Visit | Attending: Radiation Oncology | Admitting: Radiation Oncology

## 2024-01-17 DIAGNOSIS — M47814 Spondylosis without myelopathy or radiculopathy, thoracic region: Secondary | ICD-10-CM | POA: Insufficient documentation

## 2024-01-17 DIAGNOSIS — M47816 Spondylosis without myelopathy or radiculopathy, lumbar region: Secondary | ICD-10-CM | POA: Insufficient documentation

## 2024-01-18 DIAGNOSIS — M47814 Spondylosis without myelopathy or radiculopathy, thoracic region: Secondary | ICD-10-CM | POA: Diagnosis not present

## 2024-01-18 DIAGNOSIS — M47816 Spondylosis without myelopathy or radiculopathy, lumbar region: Secondary | ICD-10-CM | POA: Diagnosis not present

## 2024-01-21 DIAGNOSIS — M47816 Spondylosis without myelopathy or radiculopathy, lumbar region: Secondary | ICD-10-CM | POA: Diagnosis not present

## 2024-02-03 DIAGNOSIS — N1831 Chronic kidney disease, stage 3a: Secondary | ICD-10-CM | POA: Diagnosis not present

## 2024-02-03 DIAGNOSIS — E7800 Pure hypercholesterolemia, unspecified: Secondary | ICD-10-CM | POA: Diagnosis not present

## 2024-02-03 DIAGNOSIS — M159 Polyosteoarthritis, unspecified: Secondary | ICD-10-CM | POA: Diagnosis not present

## 2024-02-03 DIAGNOSIS — J440 Chronic obstructive pulmonary disease with acute lower respiratory infection: Secondary | ICD-10-CM | POA: Diagnosis not present

## 2024-02-04 ENCOUNTER — Ambulatory Visit
Admission: RE | Admit: 2024-02-04 | Discharge: 2024-02-04 | Disposition: A | Discharge status: Home / Self Care | Source: Ambulatory Visit | Attending: Radiation Oncology | Admitting: Radiation Oncology

## 2024-02-04 ENCOUNTER — Other Ambulatory Visit: Payer: Self-pay

## 2024-02-04 ENCOUNTER — Encounter (HOSPITAL_COMMUNITY): Payer: Medicare HMO

## 2024-02-04 DIAGNOSIS — M47816 Spondylosis without myelopathy or radiculopathy, lumbar region: Secondary | ICD-10-CM | POA: Diagnosis not present

## 2024-02-04 DIAGNOSIS — Z510 Encounter for antineoplastic radiation therapy: Secondary | ICD-10-CM | POA: Diagnosis not present

## 2024-02-04 DIAGNOSIS — M47814 Spondylosis without myelopathy or radiculopathy, thoracic region: Secondary | ICD-10-CM | POA: Diagnosis not present

## 2024-02-04 LAB — RAD ONC ARIA SESSION SUMMARY
Course Elapsed Days: 0
Plan Fractions Treated to Date: 1
Plan Prescribed Dose Per Fraction: 0.5 Gy
Plan Total Fractions Prescribed: 6
Plan Total Prescribed Dose: 3 Gy
Reference Point Dosage Given to Date: 0.5 Gy
Reference Point Session Dosage Given: 0.5 Gy
Session Number: 1

## 2024-02-05 ENCOUNTER — Ambulatory Visit

## 2024-02-06 ENCOUNTER — Ambulatory Visit
Admission: RE | Admit: 2024-02-06 | Discharge: 2024-02-06 | Disposition: A | Discharge status: Home / Self Care | Source: Ambulatory Visit | Attending: Radiation Oncology | Admitting: Radiation Oncology

## 2024-02-06 ENCOUNTER — Other Ambulatory Visit: Payer: Self-pay

## 2024-02-06 DIAGNOSIS — M47814 Spondylosis without myelopathy or radiculopathy, thoracic region: Secondary | ICD-10-CM | POA: Diagnosis not present

## 2024-02-06 LAB — RAD ONC ARIA SESSION SUMMARY
Course Elapsed Days: 2
Plan Fractions Treated to Date: 2
Plan Prescribed Dose Per Fraction: 0.5 Gy
Plan Total Fractions Prescribed: 6
Plan Total Prescribed Dose: 3 Gy
Reference Point Dosage Given to Date: 1 Gy
Reference Point Session Dosage Given: 0.5 Gy
Session Number: 2

## 2024-02-07 ENCOUNTER — Ambulatory Visit

## 2024-02-08 ENCOUNTER — Ambulatory Visit (HOSPITAL_COMMUNITY)

## 2024-02-08 ENCOUNTER — Other Ambulatory Visit: Payer: Self-pay

## 2024-02-08 ENCOUNTER — Ambulatory Visit
Admission: RE | Admit: 2024-02-08 | Discharge: 2024-02-08 | Disposition: A | Discharge status: Home / Self Care | Source: Ambulatory Visit | Attending: Radiation Oncology | Admitting: Radiation Oncology

## 2024-02-08 DIAGNOSIS — M47814 Spondylosis without myelopathy or radiculopathy, thoracic region: Secondary | ICD-10-CM | POA: Diagnosis not present

## 2024-02-08 LAB — RAD ONC ARIA SESSION SUMMARY
Course Elapsed Days: 4
Plan Fractions Treated to Date: 3
Plan Prescribed Dose Per Fraction: 0.5 Gy
Plan Total Fractions Prescribed: 6
Plan Total Prescribed Dose: 3 Gy
Reference Point Dosage Given to Date: 1.5 Gy
Reference Point Session Dosage Given: 0.5 Gy
Session Number: 3

## 2024-02-11 ENCOUNTER — Other Ambulatory Visit: Payer: Self-pay

## 2024-02-11 ENCOUNTER — Ambulatory Visit
Admission: RE | Admit: 2024-02-11 | Discharge: 2024-02-11 | Disposition: A | Source: Ambulatory Visit | Attending: Radiation Oncology

## 2024-02-11 DIAGNOSIS — M47814 Spondylosis without myelopathy or radiculopathy, thoracic region: Secondary | ICD-10-CM | POA: Diagnosis not present

## 2024-02-11 LAB — RAD ONC ARIA SESSION SUMMARY
Course Elapsed Days: 7
Plan Fractions Treated to Date: 4
Plan Prescribed Dose Per Fraction: 0.5 Gy
Plan Total Fractions Prescribed: 6
Plan Total Prescribed Dose: 3 Gy
Reference Point Dosage Given to Date: 2 Gy
Reference Point Session Dosage Given: 0.5 Gy
Session Number: 4

## 2024-02-13 ENCOUNTER — Other Ambulatory Visit: Payer: Self-pay

## 2024-02-13 ENCOUNTER — Ambulatory Visit: Admission: RE | Admit: 2024-02-13 | Discharge: 2024-02-13 | Attending: Radiation Oncology

## 2024-02-13 ENCOUNTER — Ambulatory Visit
Admission: RE | Admit: 2024-02-13 | Discharge: 2024-02-13 | Disposition: A | Discharge status: Home / Self Care | Source: Ambulatory Visit | Attending: Radiation Oncology | Admitting: Radiation Oncology

## 2024-02-13 DIAGNOSIS — Z510 Encounter for antineoplastic radiation therapy: Secondary | ICD-10-CM | POA: Diagnosis not present

## 2024-02-13 DIAGNOSIS — M47814 Spondylosis without myelopathy or radiculopathy, thoracic region: Secondary | ICD-10-CM | POA: Diagnosis not present

## 2024-02-13 LAB — RAD ONC ARIA SESSION SUMMARY
Course Elapsed Days: 9
Plan Fractions Treated to Date: 5
Plan Prescribed Dose Per Fraction: 0.5 Gy
Plan Total Fractions Prescribed: 6
Plan Total Prescribed Dose: 3 Gy
Reference Point Dosage Given to Date: 2.5 Gy
Reference Point Session Dosage Given: 0.5 Gy
Session Number: 5

## 2024-02-15 ENCOUNTER — Ambulatory Visit
Admission: RE | Admit: 2024-02-15 | Discharge: 2024-02-15 | Disposition: A | Discharge status: Home / Self Care | Source: Ambulatory Visit | Attending: Radiation Oncology | Admitting: Radiation Oncology

## 2024-02-15 ENCOUNTER — Other Ambulatory Visit: Payer: Self-pay

## 2024-02-15 DIAGNOSIS — M47814 Spondylosis without myelopathy or radiculopathy, thoracic region: Secondary | ICD-10-CM | POA: Diagnosis not present

## 2024-02-15 LAB — RAD ONC ARIA SESSION SUMMARY
Course Elapsed Days: 11
Plan Fractions Treated to Date: 6
Plan Prescribed Dose Per Fraction: 0.5 Gy
Plan Total Fractions Prescribed: 6
Plan Total Prescribed Dose: 3 Gy
Reference Point Dosage Given to Date: 3 Gy
Reference Point Session Dosage Given: 0.5 Gy
Session Number: 6

## 2024-02-18 NOTE — Radiation Completion Notes (Signed)
 Patient Name: [name], Alex Franklin: [id number] Date of Birth: [birth year] Referring Physician: ARTIST COHENS, M.D. Date of Service: 2024-02-18 Radiation Oncologist: Estefana Cha, M.D. 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, thoracic region Staging on 2002-01-15: Malignant neoplasm of prostate (HCC) T=pT2, N=pN0, M=cM0 Staging on 2001-11-28: Malignant neoplasm of prostate (HCC) T=cT1c, N=cN0, M=cM0 Intent: Palliative     ==========DELIVERED PLANS==========  First Treatment Date: 2024-02-04 Last Treatment Date: 2024-02-15   Plan Name: Spine_L Site: Back Technique: 3D Mode: Photon Dose Per Fraction: 0.5 Gy Prescribed Dose (Delivered / Prescribed): 3 Gy / 3 Gy Prescribed Fxs (Delivered / Prescribed): 6 / 6     ==========ON TREATMENT VISIT DATES========== 2024-02-13     ==========UPCOMING VISITS========== 04/07/2024 [facility] ONC FOLLOW UP 30 Cha Estefana, MD  03/11/2024 [facility] OFFICE VISIT Court Dorn PARAS, MD  02/21/2024 [facility] VAS US  ABI [facility] 10  02/21/2024 [facility] VAS US  LE ARTERIAL HVC-VASC 10        ==========APPENDIX - ON TREATMENT VISIT NOTES==========   See weekly On Treatment Notes in Epic for details in the Media tab (listed as Progress notes on the On Treatment Visit Dates listed above).

## 2024-02-20 DIAGNOSIS — E7800 Pure hypercholesterolemia, unspecified: Secondary | ICD-10-CM | POA: Diagnosis not present

## 2024-02-20 DIAGNOSIS — N1831 Chronic kidney disease, stage 3a: Secondary | ICD-10-CM | POA: Diagnosis not present

## 2024-02-21 ENCOUNTER — Ambulatory Visit (HOSPITAL_COMMUNITY)
Admission: RE | Admit: 2024-02-21 | Discharge: 2024-02-21 | Attending: Cardiovascular Disease | Admitting: Cardiovascular Disease

## 2024-02-21 DIAGNOSIS — I739 Peripheral vascular disease, unspecified: Secondary | ICD-10-CM

## 2024-02-22 ENCOUNTER — Ambulatory Visit: Payer: Self-pay | Admitting: Cardiovascular Disease

## 2024-02-23 LAB — VAS US ABI WITH/WO TBI
Left ABI: 0.86
Right ABI: 0.77

## 2024-03-11 ENCOUNTER — Encounter: Payer: Self-pay | Admitting: Cardiovascular Disease

## 2024-03-11 ENCOUNTER — Ambulatory Visit: Attending: Cardiovascular Disease | Admitting: Cardiovascular Disease

## 2024-03-11 VITALS — BP 120/56 | HR 60 | Ht 66.0 in | Wt 150.0 lb

## 2024-03-11 DIAGNOSIS — E782 Mixed hyperlipidemia: Secondary | ICD-10-CM

## 2024-03-11 DIAGNOSIS — I10 Essential (primary) hypertension: Secondary | ICD-10-CM | POA: Diagnosis not present

## 2024-03-11 DIAGNOSIS — I739 Peripheral vascular disease, unspecified: Secondary | ICD-10-CM

## 2024-03-11 NOTE — Assessment & Plan Note (Signed)
 History of hyperlipidemia on statin therapy with lipid profile performed 02/20/2024 revealing total cholesterol 148, LDL 72 and HDL of 63.

## 2024-03-11 NOTE — Progress Notes (Signed)
 "     03/11/2024 Alex Franklin   03/16/1940  [id number]  Primary Physician Alex Artist PARAS, MD Primary Cardiologist: Alex Franklin Lesches MD Alex Franklin, [place]  HPI:  Alex Franklin is a 84 y.o.  thin and fit appearing married Caucasian male father of 2, grandfather of 3 grandchildren who worked in the holiday representative business.  His wife Alex Franklin is a patient of mine as well and she accompanies him today.  He was referred to me for PAD by his primary cardiologist Dr.Chandrasekhar for lifestyle-limiting claudication and now I follow him for general cardiology as well..  I last saw him in the office 612/23/24.  His risk factors include 30 to 50 pack years of tobacco abuse currently smoking 1/2 pack/day.SABRA  History of hypertension and hyperlipidemia.  His mother had CABG at brother has CAD as well.  He is never had a heart attack or stroke.  He is fairly active and walks and plays golf.  Does complain of left lower extremity claudication.  He had Doppler studies performed in our office 06/06/2021 revealing a right ABI of 0.93 and a left of 0.61 with a moderate lesion in his mid right SFA and a high-grade lesion in his distal left common femoral artery.  I performed peripheral angiography on him 06/27/2021 revealing an occluded distal left common femoral artery which was calcified extending into the origin of the profunda and proximal left SFA as well as a short segment mid left SFA CTO with two-vessel runoff.  I believe he would be best served by surgical revascularization of this, involving a common femoral endarterectomy with patch angioplasty as well as profundoplasty.  I referred him to Dr. Medford Franklin for surgical evaluation.  He underwent left common femoral endarterectomy with patch angioplasty by Dr. Gaskins 07/20/2021 he was discharged home the following day.  His Dopplers performed 08/16/2021 revealed an increase in his left ABI from 0.61-0.88.  His claudication has resolved.   Since I saw him a year ago  he continues to do well.  He denies claudication.  He does have a burning sensation in his left inner thigh which does not sound vascular in nature.  His most recent Dopplers performed 02/21/2024 revealed an occluded mid right SFA representing progression of disease although he is asymptomatic on that side as well as a patent left distal common femoral endarterectomy site with a known occluded left SFA.  He otherwise denies chest pain, shortness of breath.  Active Medications[1]   Allergies[2]  Social History   Socioeconomic History   Marital status: Married    Spouse name: Not on file   Number of children: Not on file   Years of education: Not on file   Highest education level: Not on file  Occupational History   Not on file  Tobacco Use   Smoking status: Former    Current packs/day: 0.00    Types: Cigarettes    Quit date: 07/13/2013    Years since quitting: 10.6   Smokeless tobacco: Not on file  Vaping Use   Vaping status: Never Used  Substance and Sexual Activity   Alcohol use: Yes    Comment: occasionally- every couple of months   Drug use: No   Sexual activity: Not on file  Other Topics Concern   Not on file  Social History Narrative   Not on file   Social Drivers of Health   Tobacco Use: Medium Risk (03/11/2024)   Patient History    Smoking Tobacco  Use: Former    Smokeless Tobacco Use: Unknown    Passive Exposure: Not on Actuary Strain: Not on file  Food Insecurity: Not on file  Transportation Needs: Not on file  Physical Activity: Not on file  Stress: Not on file  Social Connections: Not on file  Intimate Partner Violence: Not on file  Depression (EYV7-0): Not on file  Alcohol Screen: Not on file  Housing: Not on file  Utilities: Not on file  Health Literacy: Not on file     Review of Systems: General: negative for chills, fever, night sweats or weight changes.  Cardiovascular: negative for chest pain, dyspnea on exertion, edema,  orthopnea, palpitations, paroxysmal nocturnal dyspnea or shortness of breath Dermatological: negative for rash Respiratory: negative for cough or wheezing Urologic: negative for hematuria Abdominal: negative for nausea, vomiting, diarrhea, bright red blood per rectum, melena, or hematemesis Neurologic: negative for visual changes, syncope, or dizziness All other systems reviewed and are otherwise negative except as noted above.    Blood pressure (!) 120/56, pulse 60, height 5' 6 (1.676 m), weight 150 lb (68 kg), SpO2 95%.  General appearance: alert and no distress Neck: no adenopathy, no carotid bruit, no JVD, supple, symmetrical, trachea midline, and thyroid  not enlarged, symmetric, no tenderness/mass/nodules Lungs: clear to auscultation bilaterally Heart: regular rate and rhythm, S1, S2 normal, no murmur, click, rub or gallop Extremities: extremities normal, atraumatic, no cyanosis or edema Pulses: Decreased pedal pulses Skin: Skin color, texture, turgor normal. No rashes or lesions Neurologic: Grossly normal  EKG EKG Interpretation Date/Time:  Tuesday March 11 2024 11:08:56 EST Ventricular Rate:  60 PR Interval:  192 QRS Duration:  138 QT Interval:  458 QTC Calculation: 458 R Axis:   50  Text Interpretation: Normal sinus rhythm Right bundle branch block When compared with ECG of 26-Feb-2023 08:48, No significant change was found Confirmed by Court Carrier [id number]) on 03/11/2024 11:38:40 AM    ASSESSMENT AND PLAN:   Peripheral arterial disease History of PAD status post angiography by myself/20/23 revealing occluded distal left common femoral artery extending into the proximal left SFA and origin of the profunda as well as mid left SFA CTO with two-vessel runoff.  I referred him to Dr. Gretta who performed a left common femoral endarterectomy with patch angioplasty 07/20/2021 resulting in marked improvement in his symptoms and ABI.  Claudication has resolved.  His most recent  Doppler studies however do show an occluded mid right SFA with a patent left common femoral artery.  He denies claudication.  Will continue to follow his Doppler studies on an annual basis.  Essential hypertension History of essential hypertension blood pressure measured today 120/56.  He is on amlodipine  and losartan .  Hyperlipidemia History of hyperlipidemia on statin therapy with lipid profile performed 02/20/2024 revealing total cholesterol 148, LDL 72 and HDL of 63.     Carrier DOROTHA Court MD FACP,FACC,FAHA, FSCAI 03/11/2024 12:12 PM    [1]  Current Meds  Medication Sig   acetaminophen  (TYLENOL ) 500 MG tablet Take 1,000 mg by mouth daily as needed for moderate pain.   amLODipine  (NORVASC ) 10 MG tablet Take 10 mg by mouth every morning.   aspirin  EC 81 MG tablet Take 81 mg by mouth every morning.   Cholecalciferol  (DIALYVITE VITAMIN D 5000) 125 MCG (5000 UT) capsule Take 5,000 Units by mouth daily.   doxazosin  (CARDURA ) 4 MG tablet Take 4 mg by mouth at bedtime.   ibuprofen (ADVIL) 200 MG tablet Take 600 mg  by mouth daily as needed for moderate pain.   losartan  (COZAAR ) 100 MG tablet Take 100 mg by mouth daily.   Omega-3 Fatty Acids (FISH OIL) 1000 MG CAPS Take 1,000 mg by mouth daily.   simvastatin  (ZOCOR ) 20 MG tablet Take 20 mg by mouth every morning.   SYMBICORT 80-4.5 MCG/ACT inhaler Inhale 2 puffs into the lungs 2 (two) times daily as needed (shortness of breath).   Current Facility-Administered Medications for the 03/11/24 encounter (Office Visit) with Court Alex PARAS, MD  Medication   sodium chloride  flush (NS) 0.9 % injection 3 mL  [2] No Known Allergies  "

## 2024-03-11 NOTE — Assessment & Plan Note (Signed)
 History of essential hypertension blood pressure measured today 120/56.  He is on amlodipine  and losartan .

## 2024-03-11 NOTE — Assessment & Plan Note (Signed)
 History of PAD status post angiography by myself/20/23 revealing occluded distal left common femoral artery extending into the proximal left SFA and origin of the profunda as well as mid left SFA CTO with two-vessel runoff.  I referred him to Dr. Gretta who performed a left common femoral endarterectomy with patch angioplasty 07/20/2021 resulting in marked improvement in his symptoms and ABI.  Claudication has resolved.  His most recent Doppler studies however do show an occluded mid right SFA with a patent left common femoral artery.  He denies claudication.  Will continue to follow his Doppler studies on an annual basis.

## 2024-03-11 NOTE — Patient Instructions (Signed)
 Medication Instructions:  Your physician recommends that you continue on your current medications as directed. Please refer to the Current Medication list given to you today.  *If you need a refill on your cardiac medications before your next appointment, please call your pharmacy*  Testing/Procedures: Your physician has requested that you have a lower extremity arterial duplex. During this test, ultrasound is used to evaluate arterial blood flow in the legs. Allow one hour for this exam. There are no restrictions or special instructions. This will take place at [address], 4th floor **To do in December**  Please note: We ask at that you not bring children with you during ultrasound (echo/ vascular) testing. Due to room size and safety concerns, children are not allowed in the ultrasound rooms during exams. Our front office staff cannot provide observation of children in our lobby area while testing is being conducted. An adult accompanying a patient to their appointment will only be allowed in the ultrasound room at the discretion of the ultrasound technician under special circumstances. We apologize for any inconvenience.  Your physician has requested that you have an ankle brachial index (ABI). During this test an ultrasound and blood pressure cuff are used to evaluate the arteries that supply the arms and legs with blood. Allow thirty minutes for this exam. There are no restrictions or special instructions. This will take place at [address], 4th floor  **To do in December**   Please note: We ask at that you not bring children with you during ultrasound (echo/ vascular) testing. Due to room size and safety concerns, children are not allowed in the ultrasound rooms during exams. Our front office staff cannot provide observation of children in our lobby area while testing is being conducted. An adult accompanying a patient to their appointment will only be allowed in the ultrasound room at  the discretion of the ultrasound technician under special circumstances. We apologize for any inconvenience.   Follow-Up: At [facility], you and your health needs are our priority.  As part of our continuing mission to provide you with exceptional heart care, our providers are all part of one team.  This team includes your primary Cardiologist (physician) and Advanced Practice Providers or APPs (Physician Assistants and Nurse Practitioners) who all work together to provide you with the care you need, when you need it.  Your next appointment:   12 month(s)  Provider:   Dorn Lesches, MD

## 2024-04-04 ENCOUNTER — Ambulatory Visit
Admission: RE | Admit: 2024-04-04 | Discharge: 2024-04-04 | Disposition: A | Source: Ambulatory Visit | Attending: Radiation Oncology | Admitting: Radiation Oncology

## 2024-04-04 ENCOUNTER — Encounter: Payer: Self-pay | Admitting: Radiation Oncology

## 2024-04-04 DIAGNOSIS — M47816 Spondylosis without myelopathy or radiculopathy, lumbar region: Secondary | ICD-10-CM

## 2024-04-04 NOTE — Progress Notes (Incomplete)
 Patient identity verified x2.  Patient had telephone follow up appointment.    Location-  [facility]  They completed their radiation on: 02/15/24  Does the patient complain of any of the following: Post radiation skin issues:  No Joint Pain/ Swelling:  Reports he continues to have some low back pain with ambulation. Patient is asking if he would benefit from more low dose radiation treatments to the back.  Fatigue post radiation: Yes    Additional comments if applicable:

## 2024-04-04 NOTE — Progress Notes (Signed)
"  °  Radiation Oncology         [phone] ________________________________  Name: Alex Franklin MRN: [id number]  Date: 04/04/2024  DOB: [birth year]  Follow-Up Visit Note - Conducted via telephone for patient preference.  I spoke with the patient to conduct this consult visit via telephone. The patient was notified in advance and was offered an in person or telemedicine meeting and opted to proceed with a telephone consult.   CC: Alex Artist PARAS, MD  Alex Artist PARAS, MD  No diagnosis found.  Diagnosis:   lumbar spine osteoarthritis  Interval Since Last Radiation:  6 weeks  Narrative:  The patient returns today for routine follow-up.  Reports improvement in back pain although he still has some and is curious if he would benefit from additional treatments. Denies any side effects. No skin irritation or noticeable fatigue.                              ALLERGIES:  has no known allergies.  Meds: Current Outpatient Medications  Medication Sig Dispense Refill   acetaminophen  (TYLENOL ) 500 MG tablet Take 1,000 mg by mouth daily as needed for moderate pain.     amLODipine  (NORVASC ) 10 MG tablet Take 10 mg by mouth every morning.     aspirin  EC 81 MG tablet Take 81 mg by mouth every morning.     Cholecalciferol  (DIALYVITE VITAMIN D 5000) 125 MCG (5000 UT) capsule Take 5,000 Units by mouth daily.     doxazosin  (CARDURA ) 4 MG tablet Take 4 mg by mouth at bedtime.     ibuprofen (ADVIL) 200 MG tablet Take 600 mg by mouth daily as needed for moderate pain.     losartan  (COZAAR ) 100 MG tablet Take 100 mg by mouth daily.     Omega-3 Fatty Acids (FISH OIL) 1000 MG CAPS Take 1,000 mg by mouth daily.     simvastatin  (ZOCOR ) 20 MG tablet Take 20 mg by mouth every morning.     SYMBICORT 80-4.5 MCG/ACT inhaler Inhale 2 puffs into the lungs 2 (two) times daily as needed (shortness of breath).     Current Facility-Administered Medications  Medication Dose Route Frequency Provider Last Rate Last Admin    sodium chloride  flush (NS) 0.9 % injection 3 mL  3 mL Intravenous Q12H Court Dorn PARAS, MD        Physical Findings: Deferred, telephone fu   Lab Findings: Lab Results  Component Value Date   WBC 10.3 07/21/2021   HGB 11.3 (L) 07/21/2021   HCT 33.8 (L) 07/21/2021   MCV 94.9 07/21/2021   PLT 208 07/21/2021    Radiographic Findings: No results found.  Impression:  The patient is recovering from the effects of radiation.  Had incomplete response to treatment and hoping for an additional 6 treatments.   Plan:  repeat LDRT to lumbar spine, will arrange sim  ---  This encounter was conducted via telephone. The patient has provided two factor identification and has given verbal consent for this type of encounter and has been advised to only accept a meeting of this type in a secure network environment.  Total time spent today in preparation for this visit was 25 minutes. This included patient care, imaging review, documentation, multidisciplinary discussion and coordination of care and follow up.    Estefana HERO. Maritza, M.D.       "

## 2024-04-04 NOTE — Progress Notes (Signed)
 Patient identity verified x2.  Patient had telephone follow up appointment.    Location-  [facility]  They completed their radiation on: 02/15/24  Does the patient complain of any of the following: Post radiation skin issues:  No Joint Pain/ Swelling:  Reports he continues to have some low back pain with ambulation. Patient is asking if he would benefit from more low dose radiation treatments to the back.  Fatigue post radiation: Yes    Additional comments if applicable:

## 2024-04-07 ENCOUNTER — Ambulatory Visit: Admission: RE | Admit: 2024-04-07 | Payer: Self-pay | Source: Ambulatory Visit | Admitting: Radiation Oncology

## 2024-04-07 HISTORY — DX: Personal history of irradiation: Z92.3

## 2024-04-16 ENCOUNTER — Ambulatory Visit: Attending: Radiation Oncology | Admitting: Radiation Oncology

## 2024-04-28 ENCOUNTER — Ambulatory Visit: Admitting: Radiation Oncology

## 2024-04-30 ENCOUNTER — Ambulatory Visit

## 2024-05-02 ENCOUNTER — Ambulatory Visit

## 2024-05-05 ENCOUNTER — Ambulatory Visit: Attending: Radiation Oncology

## 2024-05-07 ENCOUNTER — Ambulatory Visit

## 2024-05-09 ENCOUNTER — Ambulatory Visit
